# Patient Record
Sex: Female | Born: 1935 | Race: White | Hispanic: No | Smoking: Never smoker
Health system: Southern US, Community
[De-identification: ages and names within clinical notes are randomized; demographics above are authoritative.]

## PROBLEM LIST (undated history)

## (undated) DIAGNOSIS — G4733 Obstructive sleep apnea (adult) (pediatric): Secondary | ICD-10-CM

## (undated) DIAGNOSIS — W19XXXA Unspecified fall, initial encounter: Secondary | ICD-10-CM

## (undated) DIAGNOSIS — I509 Heart failure, unspecified: Secondary | ICD-10-CM

## (undated) DIAGNOSIS — E039 Hypothyroidism, unspecified: Secondary | ICD-10-CM

## (undated) DIAGNOSIS — K219 Gastro-esophageal reflux disease without esophagitis: Secondary | ICD-10-CM

## (undated) DIAGNOSIS — Z8719 Personal history of other diseases of the digestive system: Secondary | ICD-10-CM

## (undated) DIAGNOSIS — E7800 Pure hypercholesterolemia, unspecified: Secondary | ICD-10-CM

## (undated) DIAGNOSIS — I10 Essential (primary) hypertension: Secondary | ICD-10-CM

## (undated) DIAGNOSIS — C541 Malignant neoplasm of endometrium: Secondary | ICD-10-CM

## (undated) DIAGNOSIS — J189 Pneumonia, unspecified organism: Secondary | ICD-10-CM

## (undated) DIAGNOSIS — E119 Type 2 diabetes mellitus without complications: Secondary | ICD-10-CM

## (undated) DIAGNOSIS — M1990 Unspecified osteoarthritis, unspecified site: Secondary | ICD-10-CM

## (undated) DIAGNOSIS — I4891 Unspecified atrial fibrillation: Secondary | ICD-10-CM

## (undated) DIAGNOSIS — R0602 Shortness of breath: Secondary | ICD-10-CM

## (undated) DIAGNOSIS — Z9989 Dependence on other enabling machines and devices: Secondary | ICD-10-CM

## (undated) DIAGNOSIS — G459 Transient cerebral ischemic attack, unspecified: Secondary | ICD-10-CM

## (undated) DIAGNOSIS — Y92009 Unspecified place in unspecified non-institutional (private) residence as the place of occurrence of the external cause: Secondary | ICD-10-CM

## (undated) HISTORY — PX: CATARACT EXTRACTION W/ INTRAOCULAR LENS  IMPLANT, BILATERAL: SHX1307

## (undated) HISTORY — PX: ABDOMINAL HYSTERECTOMY: SHX81

---

## 1979-04-18 DIAGNOSIS — J189 Pneumonia, unspecified organism: Secondary | ICD-10-CM

## 1979-04-18 HISTORY — DX: Pneumonia, unspecified organism: J18.9

## 1994-05-17 DIAGNOSIS — E119 Type 2 diabetes mellitus without complications: Secondary | ICD-10-CM

## 1994-05-17 HISTORY — DX: Type 2 diabetes mellitus without complications: E11.9

## 1997-11-24 ENCOUNTER — Emergency Department (HOSPITAL_COMMUNITY): Admission: EM | Admit: 1997-11-24 | Discharge: 1997-11-24 | Payer: Self-pay | Admitting: Emergency Medicine

## 1998-01-01 ENCOUNTER — Other Ambulatory Visit: Admission: RE | Admit: 1998-01-01 | Discharge: 1998-01-01 | Payer: Self-pay | Admitting: Obstetrics and Gynecology

## 1999-05-26 ENCOUNTER — Ambulatory Visit (HOSPITAL_COMMUNITY): Admission: RE | Admit: 1999-05-26 | Discharge: 1999-05-26 | Payer: Self-pay | Admitting: Obstetrics and Gynecology

## 1999-05-26 ENCOUNTER — Encounter: Payer: Self-pay | Admitting: Obstetrics and Gynecology

## 1999-06-15 ENCOUNTER — Other Ambulatory Visit: Admission: RE | Admit: 1999-06-15 | Discharge: 1999-06-15 | Payer: Self-pay | Admitting: Obstetrics and Gynecology

## 2000-06-16 ENCOUNTER — Ambulatory Visit (HOSPITAL_COMMUNITY): Admission: RE | Admit: 2000-06-16 | Discharge: 2000-06-16 | Payer: Self-pay | Admitting: Obstetrics and Gynecology

## 2000-06-16 ENCOUNTER — Encounter: Payer: Self-pay | Admitting: Obstetrics and Gynecology

## 2000-11-11 ENCOUNTER — Other Ambulatory Visit: Admission: RE | Admit: 2000-11-11 | Discharge: 2000-11-11 | Payer: Self-pay | Admitting: Obstetrics and Gynecology

## 2002-08-21 ENCOUNTER — Other Ambulatory Visit: Admission: RE | Admit: 2002-08-21 | Discharge: 2002-08-21 | Payer: Self-pay | Admitting: Obstetrics and Gynecology

## 2005-07-26 ENCOUNTER — Emergency Department (HOSPITAL_COMMUNITY): Admission: EM | Admit: 2005-07-26 | Discharge: 2005-07-26 | Payer: Self-pay | Admitting: Emergency Medicine

## 2006-04-21 ENCOUNTER — Encounter: Admission: RE | Admit: 2006-04-21 | Discharge: 2006-07-20 | Payer: Self-pay | Admitting: Internal Medicine

## 2006-05-17 ENCOUNTER — Encounter: Admission: RE | Admit: 2006-05-17 | Discharge: 2006-05-17 | Payer: Self-pay | Admitting: Gastroenterology

## 2006-05-31 ENCOUNTER — Encounter: Admission: RE | Admit: 2006-05-31 | Discharge: 2006-05-31 | Payer: Self-pay | Admitting: Gastroenterology

## 2007-10-16 HISTORY — PX: ANKLE FRACTURE SURGERY: SHX122

## 2007-10-19 ENCOUNTER — Inpatient Hospital Stay (HOSPITAL_COMMUNITY): Admission: EM | Admit: 2007-10-19 | Discharge: 2007-10-21 | Payer: Self-pay | Admitting: Emergency Medicine

## 2007-10-31 ENCOUNTER — Other Ambulatory Visit: Payer: Self-pay | Admitting: Orthopedic Surgery

## 2007-10-31 ENCOUNTER — Emergency Department (HOSPITAL_COMMUNITY): Admission: EM | Admit: 2007-10-31 | Discharge: 2007-10-31 | Payer: Self-pay | Admitting: Emergency Medicine

## 2007-11-03 ENCOUNTER — Inpatient Hospital Stay (HOSPITAL_COMMUNITY): Admission: RE | Admit: 2007-11-03 | Discharge: 2007-11-04 | Payer: Self-pay | Admitting: Orthopedic Surgery

## 2010-03-24 ENCOUNTER — Encounter: Admission: RE | Admit: 2010-03-24 | Discharge: 2010-03-24 | Payer: Self-pay | Admitting: Orthopedic Surgery

## 2010-03-25 ENCOUNTER — Ambulatory Visit (HOSPITAL_BASED_OUTPATIENT_CLINIC_OR_DEPARTMENT_OTHER): Admission: RE | Admit: 2010-03-25 | Discharge: 2010-03-25 | Payer: Self-pay | Admitting: Orthopedic Surgery

## 2010-10-31 LAB — APTT
aPTT: 28 seconds (ref 24–37)
aPTT: 34 seconds (ref 24–37)

## 2010-10-31 LAB — PROTIME-INR
INR: 1.31 (ref 0.00–1.49)
INR: 2 — ABNORMAL HIGH (ref 0.00–1.49)
Prothrombin Time: 16.5 seconds — ABNORMAL HIGH (ref 11.6–15.2)
Prothrombin Time: 22.8 seconds — ABNORMAL HIGH (ref 11.6–15.2)

## 2010-10-31 LAB — BASIC METABOLIC PANEL
CO2: 27 mEq/L (ref 19–32)
Chloride: 106 mEq/L (ref 96–112)
Creatinine, Ser: 1.03 mg/dL (ref 0.4–1.2)
GFR calc Af Amer: >60 mL/min (ref >60)
Potassium: 4.2 mEq/L (ref 3.5–5.1)

## 2010-12-30 NOTE — Discharge Summary (Signed)
[name], [name] NO.:  [id number]   MEDICAL RECORD NO.:  [id number]          PATIENT TYPE:  INP   LOCATION:  1613                         FACILITY:  [facility]   PHYSICIAN:  Almedia Balls. Ranell Patrick, M.D. DATE OF BIRTH:  18-Jan-1926   DATE OF ADMISSION:  10/18/2007  DATE OF DISCHARGE:  10/21/2007                               DISCHARGE SUMMARY   ADDENDUM:   The patient is to be discharged on October 21, 2007 to [facility].  The patient was awaiting skilled nursing facility  placement.  The patient did undergo ORIF of a right trimalleolar ankle  fracture on October 26, 2007 at 0830 hours.  Arrangements will be made for  the patient to be transferred to this [facility] on October 26, 2007; the patient needs to arrive at approximately 7  a.m. to undergo surgery.  The patient will be n.p.o. after midnight on  October 25, 2007.  Patient is currently on Lovenox 120 mg subcu injection  q.12 h.  Lovenox needs to be stopped 12 hours prior to surgery.  Due to  the patient's slow progress with physical therapy and difficulty with  transfers, indwelling Foley catheter remains; it should be removed once  the patient is able to ambulate nonweightbearing on the right lower leg  with the aid of a walker.   Repeat CBC done on October 20, 2007:  H&H 9.4 and 27.1; white count was  7700.  Urinalysis showed 0-2 white blood cells per high-power field,  rare bacteria, nitrates were positive and leukocyte esterase was large.  At this time, a urine culture is pending.   The patient's condition remained unchanged from previous exam on October 20, 2007.  The patient was comfortable without any complaints on October 21, 2007.  The patient is to be discharged to a skilled nursing facility  later today.      Richardean Canal, P.A.      Almedia Balls. Ranell Patrick, M.D.  Electronically Signed    GC/MEDQ  D:  10/21/2007  T:  10/21/2007  Job:  [id number]   cc:   Leonides Grills, M.D.  Fax: [phone]

## 2010-12-30 NOTE — Consult Note (Signed)
NAMEJALEA, [name] NO.:  [id number]   MEDICAL RECORD NO.:  [id number]          PATIENT TYPE:  INP   LOCATION:  1613                         FACILITY:  [facility]   PHYSICIAN:  Leonides Grills, M.D.     DATE OF BIRTH:  [birth year]   DATE OF CONSULTATION:  10/19/2007  DATE OF DISCHARGE:                                 CONSULTATION   CHIEF COMPLAINT:  Right-ankle pain.   HISTORY:  This is a 77 year old female, who slipped and fell on the ice  yesterday and sustained the above injury.  She was then taken to [facility] ER, where x-rays were obtained.  Dr. Ranell Patrick was consulted and she  was treated with closed reduction and cast application.  She had an  abrasion of the medical aspect of the ankle, per Dr. Ranell Patrick, and this  was not an open injury.  She is comfortable in her splint and feels a  lot better.   She has insulin dependent diabetes since 1996.  She has atrial  fibrillation and is on Coumadin.  She has hypertension.  She has had a  hysterectomy in 1996.   She takes Avalide, Coreg, Lotrel, metformin, Coumadin and insulin.   She lives alone, does not smoke, no alcohol use.   No known drug allergies.   PHYSICAL EXAM:  Active, passive range of motion does not elicit any  pain.  Toes are warm.  Sensation:  Intact light touch of her toes.  No  itch in area of splint.   X-rays were reviewed and show a trimalleolar ankle fracture that is  nearly anatomically reduced with a distal tib-fib syndesmotic rupture.   IMPRESSION:  Right trimalleolar ankle fracture with a distal tibial-  fibular syndesmotic rupture.   PLAN:  I explained to Ms. Moskowitz, as well as her family, that she  will require open reduction and internal fixation of her trimalleolar  ankle fracture, likely without posterior malleolar fixation and open  reduction and internal fixation of the distal tibial syndesmosis.  Due  to the fact she is a diabetic, she will require more industrial strength  fixation, due to the likelihood that she does have neuropathy in her  feet and ankles.  I explained to her that we cannot operate at this  time, due to the fact that she is likely swollen, since it is well-  beyond the six-hour window that is typically allowed immediately after  the injury.  We will tentatively schedule her for Tuesday or Wednesday  of next week.  She will require a nursing home type of stay until the  surgery, which will be an outpatient surgery, and then once the surgery  is done, she will likely stay 23 hours at the surgery center and go back  to nursing home.   We went over the risks of surgery, which include infection, nerve or  vessel injury, nonunion, malunion, hardware irritation, hardware  failure, stiffness, arthritis, persistent pain, worsening pain,  instability, especially at the distal tib-fib syndesmosis, prolonged  recovery, Charcot arthropathy, DVT, PE, and even if she does develop a  deep infection secondary to the  diabetes, amputation were all explained.  Questions were encouraged and answered.  We will proceed with this next  week.  Elevation and active range of motion exercises were encouraged.  She is  nonweightbearing in the cast.  She will take Lovenox until one day prior  to surgery and will discontinue Lovenox 12 hours prior to surgery and  then restart her on Lovenox and likely get her back on the Coumadin  after surgery is completed.      Leonides Grills, M.D.  Electronically Signed     PB/MEDQ  D:  10/19/2007  T:  10/19/2007  Job:  [id number]

## 2010-12-30 NOTE — Consult Note (Signed)
NAMEASTELLA, DESIR NO.:  [id number]   MEDICAL RECORD NO.:  [id number]          PATIENT TYPE:  EMS   LOCATION:  ED                           FACILITY:  [facility]   PHYSICIAN:  Madaline Savage, MD        DATE OF BIRTH:  07-Aug-1926   DATE OF CONSULTATION:  10/18/2007  DATE OF DISCHARGE:                                 CONSULTATION   CONSULTING PHYSICIAN:  Dr. Ranell Patrick.   REASON FOR CONSULTATION:  Cardiac clearance and medical management.   HISTORY OF PRESENT ILLNESS:  Ms. Wittmann is a 77 year old lady with a  history of diabetes mellitus, hypertension and atrial fibrillation who  comes to the emergency room after she slipped on ice and hit her right  leg.  She apparently has a trimalleolar fracture in her right ankle and  has a proximal and distal fibular fracture on the right side.  She is  being admitted by the orthopedic service, and we have been asked to  medically clear her for surgery.  She does not have any history of  coronary artery disease.  She denies having any chest pain or shortness  of breath with exertion.  She states she was moderately active prior to  her fall and never had discomfort with activity.  She denies any  orthopnea, paroxysmal nocturnal dyspnea, any abdominal pain, nausea,  vomiting, diarrhea, constipation.   PAST MEDICAL HISTORY:  1. Diabetes mellitus since 1996.  2. History of hypertension.  3. History of atrial fibrillation for years.   PAST SURGICAL HISTORY:  Hysterectomy in 1996.   ALLERGIES:  No known drug allergies.   CURRENT MEDICATIONS:  She is on Avalide, Coreg, Digitek, Lotrel,  metformin, Coumadin and insulin.  She cannot tell me the doses of these  medications.  I tried to reach her pharmacy, but they are not open at  this time.   SOCIAL HISTORY:  She lives at home.  There is no history of smoking,  alcohol or drug abuse.   FAMILY HISTORY:  Noncontributory.   REVIEW OF SYSTEMS:  GENERAL:  She denies any recent weight  loss, weight  gain.  No fevers or chills.  HEENT:  No headaches, no blurred vision, no  sore throat.  CARDIOVASCULAR:  Denies chest pain.  The patient has no  paroxysmal nocturnal dyspnea, orthopnea.  GASTROINTESTINAL:  No  abdominal pain, nausea, vomiting, diarrhea, or constipation.  GENITOURINARY:  No dysuria, polyuria.   PHYSICAL EXAMINATION:  GENERAL:  She is alert and oriented x3.  VITAL SIGNS:  Temperature is 97, pulse rate 106, blood pressure 159/82,  respiratory rate 16, oxygen saturation 96%.  HEENT:  Head atraumatic, normocephalic.  Pupils bilaterally equal and  reactive to light.  Mucous membranes are moist.  NECK:  Supple.  No JVD.  No carotid bruits.  CARDIOVASCULAR:  S1 and S2 heard.  Regular rate and rhythm.  No murmurs,  rubs or gallops.  CHEST:  Clear to auscultation.  ABDOMEN:  Soft.  Bowel sounds heard.  EXTREMITIES:  There is swelling over the right ankle and tenderness over  the right ankle.  Labs show an INR of 2.0.  White count is elevated at 16.3.  Hemoglobin  11.8.  Platelets 232.  Sodium 137, potassium 4.1, chloride 104, bicarb  20, BUN 17, creatinine 0.9, glucose 166.  X-ray of the right ankle shows  trimalleolar fracture/dislocation.  X-ray of the right fibula shows  proximal and distal fibular fractures.  EKG shows atrial fibrillation at  a rate of 101 per minute.  There is nonspecific ST/T wave abnormality,  probably digitalis effect.   IMPRESSION:  1. Atrial fibrillation, which is rate controlled at this time.  2. Diabetes mellitus type 2.  3. Hypertension.  4. Right ankle and fibular fracture.  5. Leukocytosis.   PLAN:  1. Right ankle and fibula fractures.  She is being admitted by the      orthopedic service, who is planning on surgery.  At this time, she      is on Coumadin and her Coumadin has to be reversed prior to      surgery.  Her cardiac risk factors are diabetes mellitus and      hypertension and atrial fibrillation, but she does not  have a      history of coronary artery disease and she has no history      suggesting that she has coronary artery disease at this time.  She      has good exercise tolerance, and at this time she is being cleared      for surgery.  We will use perioperative beta blocker on this      patient to decrease the cardiac risk during surgery.  2. Atrial fibrillation.  She is rate controlled at this time.  She      uses digoxin at home.  We do not know the dose of her home digoxin.      I will check a digoxin level and start her on a low dose of      digoxin.  This can be changed to her regular dose once her home      doses are available.  I will also start her on a low dose of Coreg,      which she is on at home.  3. Diabetes mellitus.  She states she is on insulin at home.  She is      not able to tell me what kind of insulin. She tells me she takes it      once a day.  I will start her on Lantus 20 units daily and put her      on a sliding-scale.  We will change it to her home dose of insulin      once her home medications are known.  I will get a hemoglobin A1c      on her.  4. Leukocytosis.  This is likely secondary to stress reaction.  We      will get a UA C&S on her.  5. Hypertension.  I will continue her Avalide and Lotrel at low dose.      We will plan to change to her home dose once her home doses are      known.   Thank you for consulting this patient to [place].  We will continue to follow  this patient with you.      Madaline Savage, MD  Electronically Signed     PKN/MEDQ  D:  10/18/2007  T:  10/18/2007  Job:  [id number]

## 2010-12-30 NOTE — Discharge Summary (Signed)
[name], [name] NO.:  [id number]   MEDICAL RECORD NO.:  [id number]          PATIENT TYPE:  INP   LOCATION:  1613                         FACILITY:  [facility]   PHYSICIAN:  Leonides Grills, M.D.     DATE OF BIRTH:  22-Mar-1926   DATE OF ADMISSION:  10/18/2007  DATE OF DISCHARGE:                               DISCHARGE SUMMARY   ADMISSION DIAGNOSES:  1. Right ankle trimalleolar fracture.  2. History of atrial fibrillation on Coumadin.  3. Diabetes mellitus.  4. Leukocytosis.  5. Hypertension.  6. Sleep apnea with use of CPAP.   DISCHARGE DIAGNOSES:  1. Right trimalleolar ankle fracture with distal tibial fibula      syndesmotic rupture.  2. History of hypertension.  3. History of atrial fibrillation on Coumadin.  4. Diabetes mellitus type 2.  5. Leukocytosis, resolved.  6. Sleep apnea with the use of CPAP.   HISTORY OF PRESENT ILLNESS:  [name] is a 77 year old female who  slipped and fell on the ice on October 18, 2007 sustaining a right  trimalleolar ankle fracture with syndesmotic rupture.  The patient was  taken to [facility] emergency room where x-rays were obtained  and, again, showed the trimalleolar ankle fracture on the right with  syndesmotic rupture.  The patient was treated by Dr. Ranell Patrick, treated  with closed reduction and cast was applied.  Per Dr. Ranell Patrick, the patient  had an abrasion medial aspect of her ankle and this was not an open  injury.  The patient was admitted for pain control, also for medical  clearance for ORIF of the right ankle fracture.  This surgery is to  occur next week, either on Tuesday or Wednesday due to the fact that she  was unable to have surgery secondary to swelling of the ankle.   ALLERGIES:  No known drug allergies.   MEDICATIONS:  Medications on admission:  1. Avalide.  2. Coreg.  3. Digitate.  4. Lotrel.  5. Metformin.  6. Coumadin.  7. Insulin.   HOSPITAL COURSE:  The patient was admitted  through the [facility] emergency room by Dr. Ranell Patrick.  She did receive cardiac  clearance by [facility] who evaluated her.  Atrial fibrillation  was rate controlled at this time.  Her diabetes mellitus was controlled  sliding scale and she also was on metformin.  Hypertension was  controlled.  She did have leukocytosis on admission and this was felt to  be secondary to stress reaction.  A UA and C and S were checked.   On hospital day 1, October 19, 2007, the patient was resting comfortably  no complaints.  Temperature max was 101.0.  Otherwise her vital signs  were stable.  H and H were 9.8/28.8 and white count was trending down  with 8,300 from 16,300 on admission.  Hemoglobin A1C was 7.4.  Mean  plasma glucose was 186.  Cardiac enzymes were checked and the patient's  creatinine kinase total was elevated, however, troponin and CK-MB's  remained within normal limits.  No evidence of a cardiac event.  UA  showed positive  nitrates negative for leukocyte esterase, bacteria many,  few squamous cells, protein was 100, white blood cells within normal,  red blood cells were within normal limits.   On hospital day 2, the patient was doing well.  She was having no chest  pain, no shortness of breath, no nausea, vomiting or calf pain.  No  bowel movements.  Pain under control.  The patient afebrile with vital  signs stable.  Pro Time was 16.5, INR 1.3.  No other labs were ordered.  Right lower leg calf was supple and nontender.  EHL and FHL were intact.  Splint fits well.  Toes warm and dry.  Sensation intact to light touch  throughout the toes.   MEDICATIONS:  Medications on the floor:  1. Lantus 20 units subcutaneously nightly.  2. Sliding scale NovoLog subcutaneously t.i.d. WC with the following      sliding scale:      a.     CBG 70 to 100 - no units.      b.     CBG 101 to 150 - 2 units.      c.     CBG 151 to 200 - 3 units.      d.     CBG 201 to 250 - 5 units.      e.      CBG 251 to 300 - 7 units.      f.     CBG 301 to 350 - 9 units.      g.     CBG 351 to 400 - 11 units.      h.     Greater than 400 - verify with lab and call M.D.  3. NovoLog 2 to 5 units subcutaneously nightly with the following      sliding scale in place:      a.     __________ hypoglycemic.      b.     70 to 200 - 0 units.      c.     CBG 201 to 250 - 2 units.      d.     CBG 250 to 300 - 3 units.      e.     CBG 301 to 350 - 4 units.      f.     CBG 351-400 - 5 units.      g.     Greater than 400 - verify with laboratory and call M.D.  4. Glucophage 500 mg p.o. b.i.d. WC.  5. Digoxin 0.125 mg one p.o. daily.  6. Coreg 6.25 mg p.o. b.i.d. WC.  7. Avapro 150 mg one p.o. daily.  8. Hydrochlorothiazide 12.5 mg p.o. daily.  9. Norvasc 5 mg one p.o. daily.  10.Lotensin 10 mg one p.o. daily.  11.Dilaudid 7.5 mg IV q.4h. PCA.  12.Lovenox 30 mg subcutaneously b.i.d.  13.Multivitamin one p.o. daily.  14.Calcium carbonate, vitamin D3, one p.o. daily.  15.Omega-3 fatty acids 1 gram p.o. b.i.d.  16.Robaxin 500 mg IV q.8h. p.r.n. spasms.  17.Dilaudid 0.5 to 1.0 mg IV q.2h. p.r.n., none taken.  18.Tylenol 650 mg one p.o. q.6h. p.r.n., none taken.  19.Zofran 4 mg IV q.6h. p.r.n., none taken.  20.IV Benadryl 12.5 mg q.6h. p.r.n., none taken.  21.Benadryl 12.5 mg p.o. q.6h. p.r.n., none taken.  22.Propoxyphene-N 100/650 mg one to two tablets p.o. q.4h. p.r.n.      pain.   CLINICAL DATA:  X-rays as follows:  Right  ankle, three views, dated  October 18, 2007 showed trimalleolar fracture dislocation.  Right tib/fib  dated October 18, 2007 showed proximal distal fibular fracture.  Two views  of the right ankle taken October 18, 2007 showed two views through plaster  cast splint showing oblique fractures of the distal fibular shaft, well  aligned and only a few mm lateral displacement distal fragment.  Perching of the distal tibia on the talar dome.  A transverse medial  malleolar fracture present.   Three views right ankle, October 18, 2007  showed patient to be status post reduction tib/fib which is now properly  related to the talar dome.  Distal fibular fracture, medial malleolar  fracture and posterior left tibial fractures all remain evident.  Impression:  Tibia relocated relative to talus.   EKG dated October 18, 2007 showed atrial fibrillation with rapid  ventricular response, ventricular rate 101 beats per minute.  PRT axis  was 57 to 195.   LABORATORY DATA:  Routine labs on admission basic metabolic panel with  sodium 140, potassium 4.2, chloride 109, bicarb 23, glucose elevated at  179, BUN 18, creatinine 1.02. Coag's on admission PT was 23.2, elevated,  INR was 2.0.  CBC on admission white count 16,300, hemoglobin 11.8,  hematocrit 34.7, platelet count 232,000.  Urinalysis on admission with  pH 5.0, trace blood, protein was 100, positive nitrates, negative  leukocytes, few squamous epithelial cells, many bacteria, white blood  cells  0-2 within normal limits and red blood cells per high power field  were 0-2.   DISCHARGE INSTRUCTIONS:  __________ to be tested by skilled facility  physician.   SPECIAL INSTRUCTIONS:  Right lower leg posterior splint is to be kept  clean and dry and intact.  The patient is non weight-bearing on the  right leg.  She is to pivot to chair as tolerated.  The patient is to  remain on Lovenox until after her surgery.  She will need to stop the  Lovenox 12 hours prior to surgery, which is to occur either next Tuesday  or Wednesday.  The skilled nursing facility will be notified of the  patient's surgical date and location.   DIET:  CHO  modified 60 gram CHO.   ACTIVITY:  Patient is non weight-bearing right lower leg.  She is to  pivot to chair with crutches, walker and also to bedside commode as  tolerated.   CONDITION ON DISCHARGE:  Patient is stable and at this time is awaiting  skilled nursing facility placement.   PLAN:  1. Outpatient  open reduction, internal fixation of the right      trimalleolar ankle fracture with tib/fib syndesmotic rupture.  2. Bridging with Lovenox until after surgery and then place the      patient back on Coumadin after surgery.  3. The patient's urinalysis will be repeated with culture and      sensitivity prior to the patient's discharge.  4. Also, will have the patient work with PT/OT on pivoting and      transfers.  If the patient is able to pivot and transfer well, and      maintain her non weight-bearing on the right lower leg, then Foley      catheter will be removed; otherwise, the patient will be sent with      indwelling Foley catheter.      Richardean Canal, P.A.      Leonides Grills, M.D.  Electronically Signed    GC/MEDQ  D:  10/20/2007  T:  10/20/2007  Job:  [id number]

## 2010-12-30 NOTE — Op Note (Signed)
[name], [name] NO.:  [id number]   MEDICAL RECORD NO.:  [id number]          PATIENT TYPE:  INP   LOCATION:  1604                         FACILITY:  [facility]   PHYSICIAN:  Leonides Grills, M.D.     DATE OF BIRTH:  [birth year]   DATE OF PROCEDURE:  11/03/2007  DATE OF DISCHARGE:                               OPERATIVE REPORT   PREOPERATIVE DIAGNOSIS:  1. Right trimalleolar ankle fracture.  2. Right distal tib-fib syndesmosis.   POSTOPERATIVE DIAGNOSIS:  1. Right trimalleolar ankle fracture.  2. Right distal tib-fib syndesmosis.   OPERATION:  1. Open reduction and internal fixation right trimalleolar ankle      fracture without posterior malleolar fixation.  2. Open reduction and internal fixation right distal tib-fib      syndesmosis.  3. Stress x-rays, right ankle.   ANESTHESIA:  General.   SURGEON:  Leonides Grills, M.D.   ASSISTANT:  Richardean Canal, P.A.-C.   ESTIMATED BLOOD LOSS:  Minimal.   TOURNIQUET TIME:  Approximately 1 hour.   COMPLICATIONS:  None.   DISPOSITION:  Stable to the PR.   INDICATIONS:  This 77 year old female is a diabetic and has peripheral  neuropathy.  She sustained the above injury.  She was consented for the  above procedure.  All risks including infection, nerve or vessel injury,  nonunion, malunion, hardware rotation or hardware failure, stiffness,  arthritis, persistent pain, worsening pain, Charcot arthropathy, deep  infection, and the possibility of amputation were all explained,  questions were encouraged and answered.   OPERATION:  The patient was brought to the operating room and placed in  a supine position. After adequate general endotracheal anesthesia was  administered as well as Ancef 1 gram IV piggyback, a bump was placed on  the right ipsilateral hip.  The right lower extremity is prepped and  draped in a sterile manner over a proximally placed thigh tourniquet.  The limb was gravity exsanguinated and  tourniquet elevated to 290 mmHg.  A longitudinal incision full thickness over the lateral aspect of the  lateral malleolus was then made.  Dissection was carried down directly  to bone full thickness flap.  We then identified the fracture. The  lateral malleolus was then anatomically reduced.  A seven hole 1/3  tubular plate was then applied.  Three 3.5 mm fully threaded cortical  set screws were placed in the proximal fragment while the fracture was  reduced.  We then reduced the syndesmosis and with this held in a  reduced position, two 2 mm K-wires were then placed. This held it in a  provisional fixation and reduction. We then applied one 3.5 mm fully  threaded cortical set screw distal to the fracture site.  We then made a  longitudinal incision over the medial malleolus.  There was a small area  of eschar on the anterior aspect of the ankle from a previous fracture  pressure sore, but this was done prior to the reduction, likely when the  ankle was dislocated. We made the incision about 1 cm posterior to this  and was wrinkles in the skin prior to the  surgery, as well, and there  was minimal swelling.  Dissection was carried down full thickness  directly to bone.  The fracture site was encountered.  We opened the  fracture site and removed any hematoma and any fragments in the joint.  We removed the hematoma and periosteum that was flapped into the  fracture site.  We anatomically reduced the fracture with a two point  reduction clamp and put two 3.5 mm fully threaded cortical set screws  using a 3.5 mm drill hole respectively.  This had excellent purchase and  maintenance of the compression.  The clamp was removed at the time the x-  rays were obtained.  We then went back to the syndesmosis.  This was  anatomically reduced with a two point reduction clamp and three 3.5 mm  fully threaded cortical set screws were placed using a 3.2 mm drill hole  respectively.  This was a four  cortices syndesmotic screw and this had  excellent purchase and fixation. Final stress x-rays, AP and lateral and  mortise views, and showed no gross motion, fixation in proper position  and excellent alignment, as well, posterior translation of the talus  from the ankle mortise showed no instability posteriorly and we did not  proceed with fixation of the posterior malleolus fracture fragment. The  screws that were placed were 4.5 mm fully threaded cortical set screws  from [employer].  The tourniquet was deflated, hemostasis was obtained.  No  pulsatile bleeding.  The wound was copiously irrigated with normal  saline.  The subcu was closed with 2-0 Vicryl and the skin was closed  with 4-0 nylon.  A sterile dressing was applied.  A modified Jones  dressing was applied with the ankle in neutral dorsiflexion.  The  patient was stable to the PR.      Leonides Grills, M.D.  Electronically Signed     PB/MEDQ  D:  11/03/2007  T:  11/03/2007  Job:  [id number]

## 2010-12-30 NOTE — Discharge Summary (Signed)
[name], [name] NO.:  [id number]   MEDICAL RECORD NO.:  [id number]          PATIENT TYPE:  INP   LOCATION:  1604                         FACILITY:  [facility]   PHYSICIAN:  [name], M.D.     DATE OF BIRTH:  1926-05-19   DATE OF ADMISSION:  11/03/2007  DATE OF DISCHARGE:                               DISCHARGE SUMMARY   ADMISSION DIAGNOSES:  1. Right trimalleolar ankle fracture.  2. History of atrial fibrillation on Coumadin.  3. Diabetes mellitus.  4. Hypertension.  5. Sleep apnea with the use of CPAP.   DISCHARGE DIAGNOSES:  1. Status post open reduction internal fixation of right trimalleolar      ankle fracture without posterior malleolar fixation.  2. Open reduction internal fixation of right distal tib-fib      synostosis.  3. History of atrial fibrillation on Coumadin.  4. Diabetes mellitus.  5. Hypertension.  6. Sleep apnea.  7. Urinary tract infection.   HISTORY OF PRESENT ILLNESS:  [name] is a 77 year old female with  diabetes and peripheral neuropathy.  She sustained a right trimalleolar  ankle fracture and right distal tib-fib syndesmosis injury on October 18, 2007.  She was initially admitted for pain control and comfort status  post injury to the right ankle.  Unfortunately, due to the patient's  edema in the lower leg, surgery was postponed.  The patient was  readmitted on November 03, 2007, to undergo ORIF of the right ankle.   PROCEDURE:  November 03, 2007, right open reduction internal fixation of  trimalleolar ankle fracture without posterior malleolar fixation, open  reduction internal fixation of distal tib-fib syndesmosis.   ANESTHESIA:  General.   SURGEON:  [name], M.D.   ASSISTANT:  Sheila Canal, PA-C.   The patient underwent procedure without any complications and returned  to the postoperative care unit in good stable condition.   HOSPITAL COURSE:  The patient was admitted on November 03, 2007, to undergo  right trimalleolar ankle fracture and right distal tib-fib syndesmosis  ORIF.  Again, the patient received previous cardiac and medical  clearance on her prior admission.   On postop day #1, the patient was awake, alert, oriented.  Pain under  control.  No chest pain or shortness of breath.  The patient was  afebrile, vital signs stable.  H&H 10.6 and 30.8.  Sodium was 132,  potassium 4.8, chloride 102, bicarb 25, BUN 18, creatinine 1.08 and  glucose 166.  Hemoglobin A1c was 7.  INR was 1 on November 03, 2007.   MEDICATIONS ON THE FLOOR:  1. Sliding scale insulin NovoLog 1-11 units t.i.d. w.c.  CBG 70-100: 0      units. CBG was 101-150: 2 units, 151-200:  3 units, CBG 201-250: 5      units, CBG 251-300: 7 units, 301-350:  9 units, 351-400:  11 units,      above 400:  Verify lab and call MD.  2. NovoLog sliding scale of 2-5 units subcu nightly. CBG below 70:      Hypoglycemic protocol, CBG 70-200:  0 units, 201-250: 2 units, 251-  300:  3 units, CABG 301-350:  4 units, 351-400:  5 units, above of      400:  Verify and call MD.  3. Digoxin 0.125 mg p.o. daily.  4. Coreg 6.25 mg p.o. b.i.d. w.c.  5. Avapro 150 mg one p.o. daily.  6. Hydrochlorothiazide 12.5 mg one p.o. daily.  7. Norvasc 75 mg p.o. daily.  8. Lotensin 10 mg one p.o. daily.  9. Multivitamin 1 tablet p.o. daily.  10.Calcium carbonate/vitamin B3 one tablet p.o. daily.  11.Omega III fatty acid 1 gram p.o. b.i.d. p.c.  12.Lovenox 30 mg one subcu q.12h.  13.Glucotrol 2.5 mg one p.o. daily before breakfast.  14.Morphine 2-3 mg IV q.3h. p.r.n. severe pain.  15.Robaxin 500 mg one p.o. q.8h. p.r.n.  16.Zofran 4 mg one p.o. q.6h. p.r.n.  17.Darvocet 1-2 tablets p.o. q.4h. p.r.n.  18.__________ 4 mg p.o. q.4h. p.r.n.  19.Cipro 250 mg one p.o. q.12h. x5 days to begin on November 04, 2007.   ADMISSION LABORATORY DATA:  Hemoglobin 12.2, hematocrit 35.4.  Protime  was 13.8, INR 1, PTT was 29.  Sodium 134, potassium 4.5, chloride  101,  bicarb 24, glucose was high at 181, BUN also high at 181.  Hemoglobin  A1c was 7.  Mean plasmin glucose was 172.  Urinalysis:  Urinalysis on admission showed small blood, urine protein  was 30, leukocyte esterase was moderate,  many squamous cells, white  blood cells per high-power field were 11-20, bacteria was many, red  blood cells per high-power field was 3-6.  Urine culture on October 18, 2007, and October 20, 2007, both showed Klebsiella pneumoniae.   DISCHARGE INSTRUCTIONS:  Floor meds to be adjusted by skilled facility  physician.   SPECIAL INSTRUCTIONS:  1. Right lower leg in Jones dressing which is to be kept clean, dry      and intact.  The patient nonweightbearing on the right leg.  She is      to pivot to chair as tolerated.  2. The patient will remain on Lovenox until Coumadin becomes      therapeutic.  3. Foley catheter remains.  To be removed at skilled facility once      patient is able to pivot to chair.  4. Encouragement of wiggling toes.   DIET:  CHA modified medium.   ACTIVITY:  Patient is nonweightbearing right leg.  She is to pivot to  chair with crutches, walker, bedside commode as tolerated.   CONDITION ON DISCHARGE:  Patient discharged to skilled facility in good  stable condition.   FOLLOW UP:  Patient to follow up with Dr. Lestine Box in the office two  weeks from surgery.  Again, surgical date was November 03, 2007.  At that  time, x-rays will be obtained, Jones dressing will be removed.  The  patient will be placed in a short-leg cast.  Please call office at [phone] for appointment.      [name], P.A.      [name], M.D.  Electronically Signed    GC/MEDQ  D:  11/04/2007  T:  11/04/2007  Job:  [id number]

## 2011-05-11 LAB — URINE CULTURE: Colony Count: >=100000

## 2011-05-11 LAB — POCT I-STAT, CHEM 8
Calcium, Ion: 1.17
Chloride: 105
Glucose, Bld: 179 — ABNORMAL HIGH
HCT: 39
Hemoglobin: 13.3

## 2011-05-11 LAB — CBC
HCT: 27.1 — ABNORMAL LOW
HCT: 28.8 — ABNORMAL LOW
Hemoglobin: 9.8 — ABNORMAL LOW
Hemoglobin: 10.6 — ABNORMAL LOW
MCHC: 34
MCHC: 34.1
MCHC: 34.3
MCV: 82.1
MCV: 82.3
Platelets: 175
Platelets: 232
RBC: 3.49 — ABNORMAL LOW
RBC: 4.21
RBC: 3.69 — ABNORMAL LOW
RDW: 15
RDW: 15.5
WBC: 8.6

## 2011-05-11 LAB — BASIC METABOLIC PANEL
BUN: 17
BUN: 18
CO2: 20
CO2: 23
CO2: 24
CO2: 25
Calcium: 8.3 — ABNORMAL LOW
Calcium: 8.7
Chloride: 104
Chloride: 102
Creatinine, Ser: 0.95
GFR calc Af Amer: >60
GFR calc non Af Amer: 49 — ABNORMAL LOW
GFR calc non Af Amer: 52 — ABNORMAL LOW
Glucose, Bld: 166 — ABNORMAL HIGH
Glucose, Bld: 179 — ABNORMAL HIGH
Glucose, Bld: 166 — ABNORMAL HIGH
Potassium: 4.2
Potassium: 4.8
Sodium: 140
Sodium: 134 — ABNORMAL LOW

## 2011-05-11 LAB — DIFFERENTIAL
Basophils Relative: 0
Eosinophils Relative: 1
Monocytes Absolute: 0.7
Monocytes Relative: 9
Neutro Abs: 6

## 2011-05-11 LAB — URINALYSIS, ROUTINE W REFLEX MICROSCOPIC
Bilirubin Urine: NEGATIVE
Bilirubin Urine: NEGATIVE
Bilirubin Urine: NEGATIVE
Ketones, ur: NEGATIVE
Ketones, ur: NEGATIVE
Ketones, ur: NEGATIVE
Nitrite: POSITIVE — AB
Nitrite: NEGATIVE
Protein, ur: NEGATIVE
Specific Gravity, Urine: 1.013
Urobilinogen, UA: 0.2
Urobilinogen, UA: 1
pH: 5

## 2011-05-11 LAB — CARDIAC PANEL(CRET KIN+CKTOT+MB+TROPI)
CK, MB: 1.6
Relative Index: 0.3
Total CK: 428 — ABNORMAL HIGH
Troponin I: 0.03

## 2011-05-11 LAB — HEMOGLOBIN AND HEMATOCRIT, BLOOD
HCT: 35.4 — ABNORMAL LOW
Hemoglobin: 12.2

## 2011-05-11 LAB — PROTIME-INR
INR: 1.2
INR: 2 — ABNORMAL HIGH
Prothrombin Time: 15.4 — ABNORMAL HIGH
Prothrombin Time: 16.5 — ABNORMAL HIGH
Prothrombin Time: 23.2 — ABNORMAL HIGH
Prothrombin Time: 13.8

## 2011-05-11 LAB — URINE MICROSCOPIC-ADD ON

## 2011-05-11 LAB — APTT
aPTT: 37
aPTT: 39 — ABNORMAL HIGH

## 2011-05-11 LAB — DIGOXIN LEVEL: Digoxin Level: 0.4 — ABNORMAL LOW

## 2011-05-11 LAB — HEMOGLOBIN A1C
Hgb A1c MFr Bld: 7 — ABNORMAL HIGH
Mean Plasma Glucose: 186

## 2011-05-11 LAB — TROPONIN I: Troponin I: 0.02

## 2011-05-11 LAB — CK TOTAL AND CKMB (NOT AT ARMC): Relative Index: 0.7

## 2011-10-16 DIAGNOSIS — G459 Transient cerebral ischemic attack, unspecified: Secondary | ICD-10-CM

## 2011-10-16 HISTORY — DX: Transient cerebral ischemic attack, unspecified: G45.9

## 2011-11-09 ENCOUNTER — Encounter (HOSPITAL_BASED_OUTPATIENT_CLINIC_OR_DEPARTMENT_OTHER): Payer: Self-pay | Admitting: *Deleted

## 2011-11-09 ENCOUNTER — Emergency Department (HOSPITAL_BASED_OUTPATIENT_CLINIC_OR_DEPARTMENT_OTHER)
Admission: EM | Admit: 2011-11-09 | Discharge: 2011-11-09 | Disposition: A | Discharge status: Home / Self Care | Payer: Medicare Other | Attending: Emergency Medicine | Admitting: Emergency Medicine

## 2011-11-09 ENCOUNTER — Other Ambulatory Visit: Payer: Self-pay

## 2011-11-09 DIAGNOSIS — S0510XA Contusion of eyeball and orbital tissues, unspecified eye, initial encounter: Secondary | ICD-10-CM | POA: Insufficient documentation

## 2011-11-09 DIAGNOSIS — Z8542 Personal history of malignant neoplasm of other parts of uterus: Secondary | ICD-10-CM | POA: Insufficient documentation

## 2011-11-09 DIAGNOSIS — Z8673 Personal history of transient ischemic attack (TIA), and cerebral infarction without residual deficits: Secondary | ICD-10-CM | POA: Insufficient documentation

## 2011-11-09 DIAGNOSIS — E119 Type 2 diabetes mellitus without complications: Secondary | ICD-10-CM | POA: Insufficient documentation

## 2011-11-09 DIAGNOSIS — I10 Essential (primary) hypertension: Secondary | ICD-10-CM

## 2011-11-09 DIAGNOSIS — Y9229 Other specified public building as the place of occurrence of the external cause: Secondary | ICD-10-CM | POA: Insufficient documentation

## 2011-11-09 DIAGNOSIS — W1809XA Striking against other object with subsequent fall, initial encounter: Secondary | ICD-10-CM | POA: Insufficient documentation

## 2011-11-09 DIAGNOSIS — H409 Unspecified glaucoma: Secondary | ICD-10-CM | POA: Insufficient documentation

## 2011-11-09 DIAGNOSIS — R29898 Other symptoms and signs involving the musculoskeletal system: Secondary | ICD-10-CM | POA: Insufficient documentation

## 2011-11-09 DIAGNOSIS — R5381 Other malaise: Secondary | ICD-10-CM | POA: Insufficient documentation

## 2011-11-09 DIAGNOSIS — E039 Hypothyroidism, unspecified: Secondary | ICD-10-CM | POA: Insufficient documentation

## 2011-11-09 DIAGNOSIS — I4891 Unspecified atrial fibrillation: Secondary | ICD-10-CM | POA: Insufficient documentation

## 2011-11-09 DIAGNOSIS — N390 Urinary tract infection, site not specified: Secondary | ICD-10-CM

## 2011-11-09 HISTORY — DX: Hypothyroidism, unspecified: E03.9

## 2011-11-09 HISTORY — DX: Unspecified atrial fibrillation: I48.91

## 2011-11-09 HISTORY — DX: Essential (primary) hypertension: I10

## 2011-11-09 HISTORY — DX: Malignant neoplasm of endometrium: C54.1

## 2011-11-09 HISTORY — DX: Transient cerebral ischemic attack, unspecified: G45.9

## 2011-11-09 LAB — URINE MICROSCOPIC-ADD ON

## 2011-11-09 LAB — CBC
Hemoglobin: 11.4 g/dL — ABNORMAL LOW (ref 12.0–15.0)
MCH: 30.6 pg (ref 26.0–34.0)
MCHC: 34 g/dL (ref 30.0–36.0)
MCV: 90.1 fL (ref 78.0–100.0)

## 2011-11-09 LAB — DIFFERENTIAL
Basophils Relative: 1 % (ref 0–1)
Eosinophils Absolute: 0.3 10*3/uL (ref 0.0–0.7)
Eosinophils Relative: 4 % (ref 0–5)
Monocytes Absolute: 0.6 10*3/uL (ref 0.1–1.0)
Monocytes Relative: 9 % (ref 3–12)
Neutrophils Relative %: 53 % (ref 43–77)

## 2011-11-09 LAB — BASIC METABOLIC PANEL
BUN: 30 mg/dL — ABNORMAL HIGH (ref 6–23)
Calcium: 10 mg/dL (ref 8.4–10.5)
Creatinine, Ser: 1.6 mg/dL — ABNORMAL HIGH (ref 0.50–1.10)
GFR calc Af Amer: 33 mL/min — ABNORMAL LOW (ref >90)
GFR calc non Af Amer: 29 mL/min — ABNORMAL LOW (ref >90)
Potassium: 4.2 mEq/L (ref 3.5–5.1)

## 2011-11-09 LAB — URINALYSIS, ROUTINE W REFLEX MICROSCOPIC
Bilirubin Urine: NEGATIVE
Ketones, ur: NEGATIVE mg/dL
Nitrite: NEGATIVE
Protein, ur: 100 mg/dL — AB
Urobilinogen, UA: 0.2 mg/dL (ref 0.0–1.0)

## 2011-11-09 MED ORDER — WARFARIN SODIUM 5 MG PO TABS
5.0000 mg | ORAL_TABLET | Freq: Once | ORAL | Status: AC
  Administered 2011-11-09: 5 mg via ORAL
  Filled 2011-11-09: qty 1

## 2011-11-09 MED ORDER — DIGOXIN 125 MCG PO TABS
0.1250 mg | ORAL_TABLET | Freq: Once | ORAL | Status: AC
  Administered 2011-11-09: 0.125 mg via ORAL
  Filled 2011-11-09: qty 1

## 2011-11-09 MED ORDER — SULFAMETHOXAZOLE-TMP DS 800-160 MG PO TABS
1.0000 | ORAL_TABLET | Freq: Once | ORAL | Status: AC
  Administered 2011-11-09: 1 via ORAL
  Filled 2011-11-09: qty 1

## 2011-11-09 MED ORDER — SULFAMETHOXAZOLE-TRIMETHOPRIM 800-160 MG PO TABS: 1.0000 | ORAL_TABLET | Freq: Two times a day (BID) | ORAL | Status: AC

## 2011-11-09 NOTE — ED Notes (Signed)
Pt reports she almost fell sautrday but caught herself on wall while shopping- states she feels shaky and "legs felt weak"- b/p elevated pta 237/108

## 2011-11-09 NOTE — ED Provider Notes (Signed)
History     CSN: [id number]  Arrival date & time 11/09/11  0106   First MD Initiated Contact with Patient 11/09/11 0135      Chief Complaint  Patient presents with  . Feels weak and shaky     (Consider location/radiation/quality/duration/timing/severity/associated sxs/prior treatment) HPI This is an 78 year old white female with a history of atrial fibrillation. She states her rate and blood pressure are usually well-controlled. 2 days ago she was shopping and felt her legs were weak and she fell forward against a wall. She contused her right periorbital region. She did not fall to the ground and was able to stand up and continuing ambulating. She had another episode yesterday evening at home where she felt her legs were weak and shaky. She took her blood pressure and found to be 203/117. She checked it again this morning and found it to be 237/108 and decided to be evaluated in the emergency department. She denies any focal neurologic deficit other than the transient leg weakness. She denies headache. She denies chest pain, dyspnea, abdominal pain, dysuria, nausea or vomiting. She has chronic urinary incontinence and chronic diarrhea which have not changed.  Past Medical History  Diagnosis Date  . Hypertension   . Diabetes mellitus   . Glaucoma   . Atrial fibrillation   . Hypothyroidism   . Endometrial cancer   . TIA (transient ischemic attack)     Past Surgical History  Procedure Date  . Abdominal hysterectomy   . Ankle surgery     No family history on file.  History  Substance Use Topics  . Smoking status: Never Smoker   . Smokeless tobacco: Never Used  . Alcohol Use: No    OB History    Grav Para Term Preterm Abortions TAB SAB Ect Mult Living                  Review of Systems  All other systems reviewed and are negative.    Allergies  Review of patient's allergies indicates no known allergies.  Home Medications  No current outpatient prescriptions on  file.  BP 230/81  Pulse 62  Temp(Src) 98 F (36.7 C) (Oral)  Resp 20  SpO2 96%  Physical Exam General: Well-developed, well-nourished female in no acute distress; appearance consistent with age of record HENT: normocephalic, superficial hematoma of the right lateral periorbital region Eyes: pupils equal round and reactive to light; extraocular muscles intact; slight arcus senilis bilateral Neck: supple Heart: Irregular rhythm Lungs: clear to auscultation bilaterally Abdomen: soft; nondistended; nontender; no masses or hepatosplenomegaly; bowel sounds present Extremities: No deformity; full range of motion; pulses normal; trace edema of lower legs Neurologic: Awake, alert and oriented; motor function intact in all extremities and symmetric; no facial droop Skin: Warm and dry Psychiatric: Normal mood and affect    ED Course  Procedures (including critical care time)     MDM  EKG Interpretation:  Date & Time: 11/09/2011 1:53 AM  Rate: 77  Rhythm: atrial fibrillation  QRS Axis: normal  Intervals: normal  ST/T Wave abnormalities: nonspecific ST/T changes  Conduction Disutrbances:none  Narrative Interpretation:   Old EKG Reviewed: No significant changes  Nursing notes and vitals signs, including pulse oximetry, reviewed.  Summary of this visit's results, reviewed by myself:  Labs:  Results for orders placed during the hospital encounter of 11/09/11  CBC      Component Value Range   WBC 6.8  4.0 - 10.5 (K/uL)   RBC 3.72 (*)  3.87 - 5.11 (MIL/uL)   Hemoglobin 11.4 (*) 12.0 - 15.0 (g/dL)   HCT 45.4 (*) 09.8 - 46.0 (%)   MCV 90.1  78.0 - 100.0 (fL)   MCH 30.6  26.0 - 34.0 (pg)   MCHC 34.0  30.0 - 36.0 (g/dL)   RDW 11.9  14.7 - 82.9 (%)   Platelets 163  150 - 400 (K/uL)  DIFFERENTIAL      Component Value Range   Neutrophils Relative 53  43 - 77 (%)   Neutro Abs 3.6  1.7 - 7.7 (K/uL)   Lymphocytes Relative 33  12 - 46 (%)   Lymphs Abs 2.2  0.7 - 4.0 (K/uL)    Monocytes Relative 9  3 - 12 (%)   Monocytes Absolute 0.6  0.1 - 1.0 (K/uL)   Eosinophils Relative 4  0 - 5 (%)   Eosinophils Absolute 0.3  0.0 - 0.7 (K/uL)   Basophils Relative 1  0 - 1 (%)   Basophils Absolute 0.1  0.0 - 0.1 (K/uL)  BASIC METABOLIC PANEL      Component Value Range   Sodium 138  135 - 145 (mEq/L)   Potassium 4.2  3.5 - 5.1 (mEq/L)   Chloride 99  96 - 112 (mEq/L)   CO2 26  19 - 32 (mEq/L)   Glucose, Bld 202 (*) 70 - 99 (mg/dL)   BUN 30 (*) 6 - 23 (mg/dL)   Creatinine, Ser 5.62 (*) 0.50 - 1.10 (mg/dL)   Calcium 13.0  8.4 - 10.5 (mg/dL)   GFR calc non Af Amer 29 (*) >90 (mL/min)   GFR calc Af Amer 33 (*) >90 (mL/min)  URINALYSIS, ROUTINE W REFLEX MICROSCOPIC      Component Value Range   Color, Urine YELLOW  YELLOW    APPearance CLEAR  CLEAR    Specific Gravity, Urine 1.011  1.005 - 1.030    pH 6.0  5.0 - 8.0    Glucose, UA 100 (*) NEGATIVE (mg/dL)   Hgb urine dipstick TRACE (*) NEGATIVE    Bilirubin Urine NEGATIVE  NEGATIVE    Ketones, ur NEGATIVE  NEGATIVE (mg/dL)   Protein, ur 865 (*) NEGATIVE (mg/dL)   Urobilinogen, UA 0.2  0.0 - 1.0 (mg/dL)   Nitrite NEGATIVE  NEGATIVE    Leukocytes, UA SMALL (*) NEGATIVE   TROPONIN I      Component Value Range   Troponin I <0.30  <0.30 (ng/mL)  DIGOXIN LEVEL      Component Value Range   Digoxin Level 0.7 (*) 0.8 - 2.0 (ng/mL)  PROTIME-INR      Component Value Range   Prothrombin Time 17.1 (*) 11.6 - 15.2 (seconds)   INR 1.37  0.00 - 1.49   URINE MICROSCOPIC-ADD ON      Component Value Range   Squamous Epithelial / LPF FEW (*) RARE    WBC, UA 21-50  <3 (WBC/hpf)   RBC / HPF 3-6  <3 (RBC/hpf)   Bacteria, UA MANY (*) RARE    Casts HYALINE CASTS (*) NEGATIVE    We'll treat for urinary tract infection. Advise patient of low INR and digoxin levels. She will followup with her primary care physician for reevaluation of her hypertension.            Hanley Seamen, MD 11/09/11 [phone]

## 2011-11-11 LAB — URINE CULTURE

## 2011-11-12 NOTE — ED Notes (Signed)
+  Urine. Patient treated with Septra. Sensitive to same. Per protocol MD. °

## 2012-06-24 DIAGNOSIS — W19XXXA Unspecified fall, initial encounter: Secondary | ICD-10-CM

## 2012-06-24 DIAGNOSIS — Y92009 Unspecified place in unspecified non-institutional (private) residence as the place of occurrence of the external cause: Secondary | ICD-10-CM

## 2012-06-24 HISTORY — DX: Unspecified fall, initial encounter: W19.XXXA

## 2012-06-24 HISTORY — DX: Unspecified fall, initial encounter: Y92.009

## 2012-06-29 ENCOUNTER — Emergency Department (HOSPITAL_COMMUNITY): Payer: Medicare Other

## 2012-06-29 ENCOUNTER — Inpatient Hospital Stay (HOSPITAL_COMMUNITY)
Admission: EM | Admit: 2012-06-29 | Discharge: 2012-07-03 | DRG: 291 | Disposition: A | Discharge status: Home / Self Care | Payer: Medicare Other | Attending: Internal Medicine | Admitting: Internal Medicine

## 2012-06-29 ENCOUNTER — Encounter (HOSPITAL_COMMUNITY): Payer: Self-pay | Admitting: *Deleted

## 2012-06-29 DIAGNOSIS — Z23 Encounter for immunization: Secondary | ICD-10-CM

## 2012-06-29 DIAGNOSIS — E039 Hypothyroidism, unspecified: Secondary | ICD-10-CM | POA: Diagnosis present

## 2012-06-29 DIAGNOSIS — H409 Unspecified glaucoma: Secondary | ICD-10-CM | POA: Diagnosis present

## 2012-06-29 DIAGNOSIS — J189 Pneumonia, unspecified organism: Secondary | ICD-10-CM

## 2012-06-29 DIAGNOSIS — E669 Obesity, unspecified: Secondary | ICD-10-CM | POA: Diagnosis present

## 2012-06-29 DIAGNOSIS — E781 Pure hyperglyceridemia: Secondary | ICD-10-CM | POA: Diagnosis present

## 2012-06-29 DIAGNOSIS — IMO0001 Reserved for inherently not codable concepts without codable children: Secondary | ICD-10-CM | POA: Diagnosis present

## 2012-06-29 DIAGNOSIS — Z794 Long term (current) use of insulin: Secondary | ICD-10-CM

## 2012-06-29 DIAGNOSIS — Z6836 Body mass index (BMI) 36.0-36.9, adult: Secondary | ICD-10-CM

## 2012-06-29 DIAGNOSIS — E785 Hyperlipidemia, unspecified: Secondary | ICD-10-CM | POA: Diagnosis present

## 2012-06-29 DIAGNOSIS — N390 Urinary tract infection, site not specified: Secondary | ICD-10-CM | POA: Diagnosis present

## 2012-06-29 DIAGNOSIS — Z79899 Other long term (current) drug therapy: Secondary | ICD-10-CM

## 2012-06-29 DIAGNOSIS — G4733 Obstructive sleep apnea (adult) (pediatric): Secondary | ICD-10-CM | POA: Diagnosis present

## 2012-06-29 DIAGNOSIS — I129 Hypertensive chronic kidney disease with stage 1 through stage 4 chronic kidney disease, or unspecified chronic kidney disease: Secondary | ICD-10-CM | POA: Diagnosis present

## 2012-06-29 DIAGNOSIS — I10 Essential (primary) hypertension: Secondary | ICD-10-CM

## 2012-06-29 DIAGNOSIS — I509 Heart failure, unspecified: Secondary | ICD-10-CM

## 2012-06-29 DIAGNOSIS — E8779 Other fluid overload: Secondary | ICD-10-CM | POA: Diagnosis present

## 2012-06-29 DIAGNOSIS — J9600 Acute respiratory failure, unspecified whether with hypoxia or hypercapnia: Secondary | ICD-10-CM | POA: Diagnosis present

## 2012-06-29 DIAGNOSIS — N183 Chronic kidney disease, stage 3 unspecified: Secondary | ICD-10-CM | POA: Diagnosis present

## 2012-06-29 DIAGNOSIS — Z7982 Long term (current) use of aspirin: Secondary | ICD-10-CM

## 2012-06-29 DIAGNOSIS — D649 Anemia, unspecified: Secondary | ICD-10-CM | POA: Diagnosis present

## 2012-06-29 DIAGNOSIS — I4891 Unspecified atrial fibrillation: Secondary | ICD-10-CM | POA: Diagnosis present

## 2012-06-29 DIAGNOSIS — E119 Type 2 diabetes mellitus without complications: Secondary | ICD-10-CM | POA: Diagnosis present

## 2012-06-29 DIAGNOSIS — Z8673 Personal history of transient ischemic attack (TIA), and cerebral infarction without residual deficits: Secondary | ICD-10-CM

## 2012-06-29 DIAGNOSIS — Z7901 Long term (current) use of anticoagulants: Secondary | ICD-10-CM

## 2012-06-29 DIAGNOSIS — Z8542 Personal history of malignant neoplasm of other parts of uterus: Secondary | ICD-10-CM

## 2012-06-29 DIAGNOSIS — R112 Nausea with vomiting, unspecified: Secondary | ICD-10-CM | POA: Diagnosis not present

## 2012-06-29 DIAGNOSIS — I5033 Acute on chronic diastolic (congestive) heart failure: Principal | ICD-10-CM | POA: Diagnosis present

## 2012-06-29 HISTORY — DX: Obstructive sleep apnea (adult) (pediatric): G47.33

## 2012-06-29 HISTORY — DX: Pneumonia, unspecified organism: J18.9

## 2012-06-29 HISTORY — DX: Unspecified osteoarthritis, unspecified site: M19.90

## 2012-06-29 HISTORY — DX: Pure hypercholesterolemia, unspecified: E78.00

## 2012-06-29 HISTORY — DX: Unspecified fall, initial encounter: W19.XXXA

## 2012-06-29 HISTORY — DX: Personal history of other diseases of the digestive system: Z87.19

## 2012-06-29 HISTORY — DX: Type 2 diabetes mellitus without complications: E11.9

## 2012-06-29 HISTORY — DX: Heart failure, unspecified: I50.9

## 2012-06-29 HISTORY — DX: Unspecified place in unspecified non-institutional (private) residence as the place of occurrence of the external cause: Y92.009

## 2012-06-29 HISTORY — DX: Dependence on other enabling machines and devices: Z99.89

## 2012-06-29 HISTORY — DX: Shortness of breath: R06.02

## 2012-06-29 HISTORY — DX: Gastro-esophageal reflux disease without esophagitis: K21.9

## 2012-06-29 LAB — CBC WITH DIFFERENTIAL/PLATELET
Eosinophils Absolute: 0.2 10*3/uL (ref 0.0–0.7)
Hemoglobin: 9.6 g/dL — ABNORMAL LOW (ref 12.0–15.0)
Lymphocytes Relative: 14 % (ref 12–46)
Lymphs Abs: 1.2 10*3/uL (ref 0.7–4.0)
Monocytes Relative: 10 % (ref 3–12)
Neutro Abs: 6.3 10*3/uL (ref 1.7–7.7)
Neutrophils Relative %: 72 % (ref 43–77)
Platelets: 267 10*3/uL (ref 150–400)
RBC: 3.38 MIL/uL — ABNORMAL LOW (ref 3.87–5.11)
WBC: 8.7 10*3/uL (ref 4.0–10.5)

## 2012-06-29 LAB — COMPREHENSIVE METABOLIC PANEL
ALT: 17 U/L (ref 0–35)
Alkaline Phosphatase: 45 U/L (ref 39–117)
CO2: 21 mEq/L (ref 19–32)
Chloride: 99 mEq/L (ref 96–112)
GFR calc Af Amer: 26 mL/min — ABNORMAL LOW (ref >90)
Glucose, Bld: 278 mg/dL — ABNORMAL HIGH (ref 70–99)
Potassium: 4.5 mEq/L (ref 3.5–5.1)
Sodium: 135 mEq/L (ref 135–145)
Total Bilirubin: 0.4 mg/dL (ref 0.3–1.2)
Total Protein: 7.8 g/dL (ref 6.0–8.3)

## 2012-06-29 LAB — PRO B NATRIURETIC PEPTIDE: Pro B Natriuretic peptide (BNP): 4620 pg/mL — ABNORMAL HIGH (ref 0–450)

## 2012-06-29 LAB — PROTIME-INR: Prothrombin Time: 14 seconds (ref 11.6–15.2)

## 2012-06-29 LAB — GLUCOSE, CAPILLARY: Glucose-Capillary: 277 mg/dL — ABNORMAL HIGH (ref 70–99)

## 2012-06-29 MED ORDER — METOPROLOL SUCCINATE ER 50 MG PO TB24
50.0000 mg | ORAL_TABLET | Freq: Every day | ORAL | Status: DC
  Administered 2012-06-30 – 2012-07-03 (×4): 50 mg via ORAL
  Filled 2012-06-29 (×4): qty 1

## 2012-06-29 MED ORDER — CALCIUM CARBONATE-VITAMIN D 600-400 MG-UNIT PO TABS: 600.0000 mg | ORAL_TABLET | Freq: Every day | ORAL | Status: DC

## 2012-06-29 MED ORDER — AMLODIPINE BESYLATE 10 MG PO TABS
10.0000 mg | ORAL_TABLET | Freq: Every day | ORAL | Status: DC
  Administered 2012-06-30: 10 mg via ORAL
  Filled 2012-06-29: qty 1

## 2012-06-29 MED ORDER — WARFARIN SODIUM 10 MG PO TABS
10.0000 mg | ORAL_TABLET | Freq: Once | ORAL | Status: AC
  Administered 2012-06-29: 10 mg via ORAL
  Filled 2012-06-29 (×2): qty 1

## 2012-06-29 MED ORDER — CHLORDIAZEPOXIDE HCL 5 MG PO CAPS: 5.0000 mg | ORAL_CAPSULE | Freq: Two times a day (BID) | ORAL | Status: DC | PRN

## 2012-06-29 MED ORDER — CYANOCOBALAMIN 500 MCG PO TABS
500.0000 ug | ORAL_TABLET | Freq: Every day | ORAL | Status: DC
  Administered 2012-06-30 – 2012-07-03 (×4): 500 ug via ORAL
  Filled 2012-06-29 (×5): qty 1

## 2012-06-29 MED ORDER — INSULIN ASPART 100 UNIT/ML ~~LOC~~ SOLN
0.0000 [IU] | Freq: Three times a day (TID) | SUBCUTANEOUS | Status: DC
  Administered 2012-06-30 (×2): 7 [IU] via SUBCUTANEOUS

## 2012-06-29 MED ORDER — WARFARIN - PHARMACIST DOSING INPATIENT: Freq: Every day | Status: DC

## 2012-06-29 MED ORDER — ASPIRIN EC 81 MG PO TBEC
81.0000 mg | DELAYED_RELEASE_TABLET | Freq: Every day | ORAL | Status: DC
  Administered 2012-06-30 – 2012-07-03 (×4): 81 mg via ORAL
  Filled 2012-06-29 (×4): qty 1

## 2012-06-29 MED ORDER — CYCLOSPORINE 0.05 % OP EMUL
1.0000 [drp] | Freq: Two times a day (BID) | OPHTHALMIC | Status: DC
  Administered 2012-06-29: 1 [drp] via OPHTHALMIC
  Administered 2012-06-30: 21:00:00 via OPHTHALMIC
  Administered 2012-06-30 – 2012-07-03 (×6): 1 [drp] via OPHTHALMIC
  Filled 2012-06-29 (×9): qty 1

## 2012-06-29 MED ORDER — ACETAMINOPHEN 325 MG PO TABS
650.0000 mg | ORAL_TABLET | ORAL | Status: DC | PRN
  Administered 2012-06-30 – 2012-07-03 (×3): 650 mg via ORAL
  Filled 2012-06-29 (×3): qty 2

## 2012-06-29 MED ORDER — BRIMONIDINE TARTRATE 0.1 % OP SOLN: 1.0000 [drp] | Freq: Two times a day (BID) | OPHTHALMIC | Status: DC

## 2012-06-29 MED ORDER — FUROSEMIDE 10 MG/ML IJ SOLN
40.0000 mg | Freq: Once | INTRAMUSCULAR | Status: AC
  Administered 2012-06-29: 40 mg via INTRAVENOUS
  Filled 2012-06-29: qty 4

## 2012-06-29 MED ORDER — DIGOXIN 125 MCG PO TABS
125.0000 ug | ORAL_TABLET | Freq: Every day | ORAL | Status: DC
  Administered 2012-06-30 – 2012-07-03 (×4): 125 ug via ORAL
  Filled 2012-06-29 (×4): qty 1

## 2012-06-29 MED ORDER — BIMATOPROST 0.01 % OP SOLN
1.0000 [drp] | Freq: Every day | OPHTHALMIC | Status: DC
  Administered 2012-06-29 – 2012-07-02 (×4): 1 [drp] via OPHTHALMIC
  Filled 2012-06-29: qty 2.5

## 2012-06-29 MED ORDER — SODIUM CHLORIDE 0.9 % IV SOLN: 250.0000 mL | INTRAVENOUS | Status: DC | PRN

## 2012-06-29 MED ORDER — POLYVINYL ALCOHOL 1.4 % OP SOLN
1.0000 [drp] | Freq: Four times a day (QID) | OPHTHALMIC | Status: DC
  Administered 2012-06-29 – 2012-07-03 (×14): 1 [drp] via OPHTHALMIC
  Filled 2012-06-29: qty 15

## 2012-06-29 MED ORDER — FUROSEMIDE 10 MG/ML IJ SOLN
80.0000 mg | Freq: Two times a day (BID) | INTRAMUSCULAR | Status: DC
  Administered 2012-06-30 (×2): 80 mg via INTRAVENOUS
  Filled 2012-06-29 (×2): qty 8

## 2012-06-29 MED ORDER — INSULIN GLARGINE 100 UNIT/ML ~~LOC~~ SOLN
25.0000 [IU] | Freq: Every day | SUBCUTANEOUS | Status: DC
  Administered 2012-06-29: 25 [IU] via SUBCUTANEOUS

## 2012-06-29 MED ORDER — HEPARIN SODIUM (PORCINE) 5000 UNIT/ML IJ SOLN
5000.0000 [IU] | Freq: Three times a day (TID) | INTRAMUSCULAR | Status: DC
  Administered 2012-06-29 – 2012-07-03 (×11): 5000 [IU] via SUBCUTANEOUS
  Filled 2012-06-29 (×14): qty 1

## 2012-06-29 MED ORDER — ASPIRIN 81 MG PO TABS: 81.0000 mg | ORAL_TABLET | Freq: Every day | ORAL | Status: DC

## 2012-06-29 MED ORDER — WARFARIN SODIUM 10 MG PO TABS: 10.0000 mg | ORAL_TABLET | Freq: Once | ORAL | Status: DC

## 2012-06-29 MED ORDER — BRIMONIDINE TARTRATE 0.2 % OP SOLN
1.0000 [drp] | Freq: Two times a day (BID) | OPHTHALMIC | Status: DC
  Administered 2012-06-29 – 2012-07-03 (×8): 1 [drp] via OPHTHALMIC
  Filled 2012-06-29: qty 5

## 2012-06-29 MED ORDER — INFLUENZA VIRUS VACC SPLIT PF IM SUSP
0.5000 mL | INTRAMUSCULAR | Status: AC
  Administered 2012-06-30: 0.5 mL via INTRAMUSCULAR
  Filled 2012-06-29: qty 0.5

## 2012-06-29 MED ORDER — POTASSIUM CHLORIDE ER 10 MEQ PO TBCR
10.0000 meq | EXTENDED_RELEASE_TABLET | Freq: Every day | ORAL | Status: DC
  Administered 2012-06-30 – 2012-07-03 (×4): 10 meq via ORAL
  Filled 2012-06-29 (×4): qty 1

## 2012-06-29 MED ORDER — OMEGA-3-ACID ETHYL ESTERS 1 G PO CAPS
2.0000 g | ORAL_CAPSULE | Freq: Two times a day (BID) | ORAL | Status: DC
  Administered 2012-06-29 – 2012-07-03 (×8): 2 g via ORAL
  Filled 2012-06-29 (×9): qty 2

## 2012-06-29 MED ORDER — CALCIUM CARBONATE-VITAMIN D 500-200 MG-UNIT PO TABS
1.0000 | ORAL_TABLET | Freq: Every day | ORAL | Status: DC
  Administered 2012-06-30 – 2012-07-03 (×4): 1 via ORAL
  Filled 2012-06-29 (×4): qty 1

## 2012-06-29 MED ORDER — POLYETHYL GLYCOL-PROPYL GLYCOL 0.4-0.3 % OP SOLN: 1.0000 [drp] | Freq: Four times a day (QID) | OPHTHALMIC | Status: DC

## 2012-06-29 MED ORDER — ONDANSETRON HCL 4 MG/2ML IJ SOLN
4.0000 mg | Freq: Four times a day (QID) | INTRAMUSCULAR | Status: DC | PRN
  Administered 2012-07-01: 4 mg via INTRAVENOUS
  Filled 2012-06-29: qty 2

## 2012-06-29 MED ORDER — SODIUM CHLORIDE 0.9 % IJ SOLN
3.0000 mL | Freq: Two times a day (BID) | INTRAMUSCULAR | Status: DC
  Administered 2012-06-29 – 2012-07-03 (×8): 3 mL via INTRAVENOUS

## 2012-06-29 MED ORDER — BIOTIN 5000 MCG PO TABS: 500.0000 mg | ORAL_TABLET | Freq: Every day | ORAL | Status: DC

## 2012-06-29 MED ORDER — INSULIN ASPART 100 UNIT/ML ~~LOC~~ SOLN
0.0000 [IU] | Freq: Every day | SUBCUTANEOUS | Status: DC
  Administered 2012-06-29: 3 [IU] via SUBCUTANEOUS

## 2012-06-29 MED ORDER — LEVOTHYROXINE SODIUM 50 MCG PO TABS
50.0000 ug | ORAL_TABLET | Freq: Every day | ORAL | Status: DC
  Administered 2012-06-30 – 2012-07-03 (×4): 50 ug via ORAL
  Filled 2012-06-29 (×6): qty 1

## 2012-06-29 MED ORDER — SODIUM CHLORIDE 0.9 % IJ SOLN: 3.0000 mL | INTRAMUSCULAR | Status: DC | PRN

## 2012-06-29 NOTE — ED Notes (Signed)
PT is here with increased sob, chest pressure, and heart racing with exertion.  Cough.  Pt has history of atrial fib and is on coumadin.  [name] is cards.

## 2012-06-29 NOTE — Progress Notes (Signed)

## 2012-06-29 NOTE — H&P (Addendum)
[facility] History and Physical  [name] ZOX:[id number] DOB: 1926/02/13 DOA: 06/29/2012   PCP: Aida Puffer, MD   Chief Complaint: dyspnea  HPI:  Female with a history of diabetes, diastolic CHF, atrial fibrillation presents with three-week history of shortness of breath and peripheral edema that has worsened in the past 2 days. The patient had some subjective fevers, but did not take her temperature. There were no chills or rigors. The patient denies any coughing, hemoptysis, dysuria, hematuria, abdominal pain, dizziness chest pain. She has not had any orthopnea or PND. The patient wears CPAP at night. She does not know her settings.  Of note, the patient states that her primary care provider decreased her Bumex 2 mg twice a day to Lasix 40 mg twice a day approximately 3 weeks ago. He was during this period of time, the patient states that she has had increasing shortness of breath and peripheral edema. The patient states that she has been compliant with her medications. She does not weigh herself daily. Denies weight loss, hematochezia, melena.  She states that her appetite has been poor in the past few weeks. Assessment/Plan: Acute CHF exacerbation -likely precipitated by decrease in diuretic potency when Bumex 2mg  bid was decreased to furosemide 40mg  bid -No echocardiogram on record -Will order echo -Furosemide 80 mg IV every 12 hours -Fluid restriction, daily weights -Continue metoprolol succinate -Hold Aldactone do to renal insufficiency -Check TSH -Continue digoxin Diabetes mellitus type 2 -Give half home dose of Lantus due to poor appetite -NovoLog sliding scale -Discontinue metformin -Check morning lipids Hypertension -Discontinue clonidine -Suspect a degree of rebound hypertension as the patient was only taking 0.1 mg daily -Continue metoprolol succinate and amlodipine Hyperlipidemia Continue Lovaza Normocytic anemia -baseline hemoglobin 10-12 over past  2 years -check hemoccult Atrial fibrillation -rate controlled--continue metoprolol -continue coumadin for now -check hemoccult        Past Medical History  Diagnosis Date  . Hypertension   . Diabetes mellitus   . Glaucoma(365)   . Atrial fibrillation   . Hypothyroidism   . Endometrial cancer   . TIA (transient ischemic attack)   . Sleep apnea     CPAP at nite   Past Surgical History  Procedure Date  . Abdominal hysterectomy   . Ankle surgery    Social History:  reports that she has never smoked. She has never used smokeless tobacco. She reports that she does not drink alcohol or use illicit drugs.   Family history reviewed: No pertinent family history  No Known Allergies    Prior to Admission medications   Medication Sig Start Date End Date Taking? Authorizing Provider  amLODipine (NORVASC) 10 MG tablet Take 10 mg by mouth daily.   Yes Historical Provider, MD  aspirin 81 MG tablet Take 81 mg by mouth daily.   Yes Historical Provider, MD  bimatoprost (LUMIGAN) 0.01 % SOLN Place 1 drop into both eyes at bedtime.   Yes Historical Provider, MD  Biotin 5000 MCG TABS Take by mouth daily.   Yes Historical Provider, MD  brimonidine (ALPHAGAN P) 0.1 % SOLN Place 1 drop into both eyes every 12 (twelve) hours.   Yes Historical Provider, MD  Calcium Carbonate-Vitamin D (CALCIUM 600 + D PO) Take 1 tablet by mouth daily.   Yes Historical Provider, MD  chlordiazePOXIDE (LIBRIUM) 5 MG capsule Take 5 mg by mouth 2 (two) times daily as needed.   Yes Historical Provider, MD  cloNIDine (CATAPRES) 0.1 MG tablet Take 0.1 mg by  mouth daily.   Yes Historical Provider, MD  cyanocobalamin 500 MCG tablet Take 500 mcg by mouth daily.   Yes Historical Provider, MD  cycloSPORINE (RESTASIS) 0.05 % ophthalmic emulsion Place 1 drop into both eyes every 12 (twelve) hours.   Yes Historical Provider, MD  digoxin (LANOXIN) 0.125 MG tablet Take 125 mcg by mouth daily.   Yes Historical Provider, MD    ergocalciferol (VITAMIN D2) 50000 UNITS capsule Take 50,000 Units by mouth once a week. On Saturdays   Yes Historical Provider, MD  furosemide (LASIX) 40 MG tablet Take 40 mg by mouth 2 (two) times daily.   Yes Historical Provider, MD  Insulin Aspart (NOVOLOG FLEXPEN [place]) Inject into the skin 3 (three) times daily before meals. 10 units before breakfast, 15 units before lunch, 25 units before dinner   Yes Historical Provider, MD  insulin glargine (LANTUS SOLOSTAR) 100 UNIT/ML injection Inject 50 Units into the skin daily.    Yes Historical Provider, MD  levothyroxine (SYNTHROID, LEVOTHROID) 50 MCG tablet Take 50 mcg by mouth daily.   Yes Historical Provider, MD  metFORMIN (GLUCOPHAGE) 500 MG tablet Take 500 mg by mouth 2 (two) times daily with a meal.   Yes Historical Provider, MD  metoprolol succinate (TOPROL-XL) 50 MG 24 hr tablet Take 50 mg by mouth daily. Take with or immediately following a meal.   Yes Historical Provider, MD  Multiple Vitamins-Minerals (CENTRUM SILVER PO) Take 1 tablet by mouth daily.   Yes Historical Provider, MD  omega-3 acid ethyl esters (LOVAZA) 1 G capsule Take 2 g by mouth 2 (two) times daily.   Yes Historical Provider, MD  Polyethyl Glycol-Propyl Glycol (SYSTANE OP) Place 1-4 drops into both eyes 4 (four) times daily as needed. For dry eyes   Yes Historical Provider, MD  potassium chloride (K-DUR) 10 MEQ tablet Take 10 mEq by mouth daily.   Yes Historical Provider, MD  spironolactone (ALDACTONE) 25 MG tablet Take 25 mg by mouth daily.   Yes Historical Provider, MD  warfarin (COUMADIN) 5 MG tablet Take 5-7.5 mg by mouth every evening. Mon & Fri-1 tab (5 mg total) ; All other days-1.5 tabs (7.5 mg total)   Yes Historical Provider, MD    Review of Systems:  Constitutional:  No weight loss, night sweats,fatigue.  Head&Eyes: No headache.  No vision loss.  No eye pain or scotoma ENT:  No Difficulty swallowing,Tooth/dental problems,Sore throat,  No ear ache, post nasal  drip,  Cardio-vascular:  No chest pain, Orthopnea, PND  dizziness, palpitations  GI:  No  abdominal pain, nausea, vomiting, diarrhea, loss of appetite, hematochezia, melena, heartburn, indigestion, Resp:  No shortness of breath with exertion or at rest. No cough. No coughing up of blood .No wheezing.No chest wall deformity  Skin:  no rash or lesions.  GU:  no dysuria, change in color of urine, no urgency or frequency. No flank pain.  Musculoskeletal:  No joint pain or swelling. No decreased range of motion. No back pain.  Psych:  No change in mood or affect. No depression or anxiety. Neurologic: No headache, no dysesthesia, no focal weakness, no vision loss. No syncope  Physical Exam: Filed Vitals:   06/29/12 1744 06/29/12 1815 06/29/12 1849 06/29/12 1849  BP: 167/49 169/50 151/107 149/98  Pulse: 86 78 82 84  Temp:      TempSrc:      Resp: 18 26 18 20   SpO2: 92% 89% 95% 92%   General:  A&O x 3, NAD, nontoxic, pleasant/cooperative  Head/Eye: No conjunctival hemorrhage, no icterus, [place]/AT, No nystagmus ENT:  No icterus,  No thrush, good dentition, no pharyngeal exudate Neck:  No masses, no lymphadenpathy, no bruits CV: I RRR, no rub, no gallop, no S3 Lung:  Bilateral crackles. Good air movement. No wheezes. Abdomen: soft/NT, +BS, nondistended, no peritoneal signs Ext: No cyanosis, No rashes, No petechiae, No lymphangitis, 3+ edema   Labs on Admission:  Basic Metabolic Panel:  Lab 06/29/12 1610  NA 135  K 4.5  CL 99  CO2 21  GLUCOSE 278*  BUN 45*  CREATININE 1.92*  CALCIUM 9.3  MG --  PHOS --   Liver Function Tests:  Lab 06/29/12 1610  AST 19  ALT 17  ALKPHOS 45  BILITOT 0.4  PROT 7.8  ALBUMIN 3.1*   No results found for this basename: LIPASE:5,AMYLASE:5 in the last 168 hours No results found for this basename: AMMONIA:5 in the last 168 hours CBC:  Lab 06/29/12 1610  WBC 8.7  NEUTROABS 6.3  HGB 9.6*  HCT 29.7*  MCV 87.9  PLT 267   Cardiac  Enzymes: No results found for this basename: CKTOTAL:5,CKMB:5,CKMBINDEX:5,TROPONINI:5 in the last 168 hours BNP: No components found with this basename: POCBNP:5 CBG: No results found for this basename: GLUCAP:5 in the last 168 hours  Radiological Exams on Admission: Dg Chest 2 View  06/29/2012  *RADIOLOGY REPORT*  Clinical Data: Shortness of breath.  CHEST - 2 VIEW  Comparison: Chest x-ray 03/24/2010.  Findings: Compared to the prior examination and there is a new multifocal bilateral air space disease which is predominately perihilar (left greater than right) in location.  There is some associated cephalization of the pulmonary vasculature, indistinctness of the interstitial markings and Kerley B lines, suggestive that these findings may be related to pulmonary edema. Trace bilateral pleural effusions.  Heart size is mildly enlarged. The patient is rotated to the left on today's exam, resulting in distortion of the mediastinal contours and reduced diagnostic sensitivity and specificity for mediastinal pathology. Atherosclerosis of the thoracic aorta.  IMPRESSION: 1.  Findings, as above, concerning for congestive heart failure. 2.  Given the relatively extensive airspace disease that is slightly asymmetric involving the left upper lobe to a greater extent than the remainder of the lungs, the possibility of superimposed acute infection is not excluded.  Clinical correlation is recommended. 3.  Atherosclerosis.   Original Report Authenticated By: Trudie Reed, M.D.     EKG: Independently reviewed. Afib, 89    Time spent:70 minutes Code Status:   full Family Communication:   daughter at bedside   Aron Needles, DO  [facility] Pager [phone]  If 7PM-7AM, please contact night-coverage [email or web address] Password [email or web address] 06/29/2012, 7:01 PM

## 2012-06-29 NOTE — ED Provider Notes (Signed)
History     CSN: [id number]  Arrival date & time 06/29/12  1543   First MD Initiated Contact with Patient 06/29/12 1725      Chief Complaint  Patient presents with  . Shortness of Breath  . Heart racing     (Consider location/radiation/quality/duration/timing/severity/associated sxs/prior treatment) The history is provided by the patient.  [name] is a 78 y.o. female hx of HTN, DM, CHF afib on coumadin here with SOB. Worsen SOB over the last 3 days. She noticed SOB when she ambulates and now has hard time walking to the bathroom. Denies PND but she is on cpap at night. Her legs have been swollen for several weeks. She was recently started on lasix but it hasn't helped. No fevers, + nonproductive cough.   Past Medical History  Diagnosis Date  . Hypertension   . Diabetes mellitus   . Glaucoma(365)   . Atrial fibrillation   . Hypothyroidism   . Endometrial cancer   . TIA (transient ischemic attack)   . Sleep apnea     CPAP at nite    Past Surgical History  Procedure Date  . Abdominal hysterectomy   . Ankle surgery     No family history on file.  History  Substance Use Topics  . Smoking status: Never Smoker   . Smokeless tobacco: Never Used  . Alcohol Use: No    OB History    Grav Para Term Preterm Abortions TAB SAB Ect Mult Living                  Review of Systems  Respiratory: Positive for cough and shortness of breath.   Cardiovascular: Positive for leg swelling.  All other systems reviewed and are negative.    Allergies  Review of patient's allergies indicates no known allergies.  Home Medications   Current Outpatient Rx  Name  Route  Sig  Dispense  Refill  . AMLODIPINE BESYLATE 10 MG PO TABS   Oral   Take 10 mg by mouth daily.         . ASPIRIN 81 MG PO TABS   Oral   Take 81 mg by mouth daily.         Marland Kitchen BIMATOPROST 0.01 % OP SOLN   Both Eyes   Place 1 drop into both eyes at bedtime.         Marland Kitchen BIOTIN 5000 MCG PO TABS   Oral   Take by mouth daily.         Marland Kitchen BRIMONIDINE TARTRATE 0.1 % OP SOLN   Both Eyes   Place 1 drop into both eyes every 12 (twelve) hours.         Marland Kitchen CALCIUM 600 + D PO   Oral   Take 1 tablet by mouth daily.         . CHLORDIAZEPOXIDE HCL 5 MG PO CAPS   Oral   Take 5 mg by mouth 2 (two) times daily as needed.         Marland Kitchen CLONIDINE HCL 0.1 MG PO TABS   Oral   Take 0.1 mg by mouth daily.         . CYANOCOBALAMIN 500 MCG PO TABS   Oral   Take 500 mcg by mouth daily.         . CYCLOSPORINE 0.05 % OP EMUL   Both Eyes   Place 1 drop into both eyes every 12 (twelve) hours.         Marland Kitchen  DIGOXIN 0.125 MG PO TABS   Oral   Take 125 mcg by mouth daily.         . ERGOCALCIFEROL 50000 UNITS PO CAPS   Oral   Take 50,000 Units by mouth once a week. On Saturdays         . FUROSEMIDE 40 MG PO TABS   Oral   Take 40 mg by mouth 2 (two) times daily.         Marland Kitchen NOVOLOG FLEXPEN [place]   Subcutaneous   Inject into the skin 3 (three) times daily before meals. 10 units before breakfast, 15 units before lunch, 25 units before dinner         . INSULIN GLARGINE 100 UNIT/ML [place] SOLN   Subcutaneous   Inject 50 Units into the skin daily.          Marland Kitchen LEVOTHYROXINE SODIUM 50 MCG PO TABS   Oral   Take 50 mcg by mouth daily.         Marland Kitchen METFORMIN HCL 500 MG PO TABS   Oral   Take 500 mg by mouth 2 (two) times daily with a meal.         . METOPROLOL SUCCINATE ER 50 MG PO TB24   Oral   Take 50 mg by mouth daily. Take with or immediately following a meal.         . CENTRUM SILVER PO   Oral   Take 1 tablet by mouth daily.         . OMEGA-3-ACID ETHYL ESTERS 1 G PO CAPS   Oral   Take 2 g by mouth 2 (two) times daily.         [place] OP   Both Eyes   Place 1-4 drops into both eyes 4 (four) times daily as needed. For dry eyes         . POTASSIUM CHLORIDE ER 10 MEQ PO TBCR   Oral   Take 10 mEq by mouth daily.         Marland Kitchen SPIRONOLACTONE 25 MG PO TABS   Oral    Take 25 mg by mouth daily.         . WARFARIN SODIUM 5 MG PO TABS   Oral   Take 5-7.5 mg by mouth every evening. Mon & Fri-1 tab (5 mg total) ; All other days-1.5 tabs (7.5 mg total)           BP 169/50  Pulse 78  Temp 98.2 F (36.8 C) (Oral)  Resp 26  SpO2 89%  Physical Exam  Nursing note and vitals reviewed. Constitutional: She is oriented to person, place, and time. She appears well-developed and well-nourished.       Slightly tachypneic, but comfortable   HENT:  Head: Normocephalic.  Mouth/Throat: Oropharynx is clear and moist.  Eyes: Conjunctivae normal are normal. Pupils are equal, round, and reactive to light.  Neck: Normal range of motion. Neck supple.  Cardiovascular: Normal rate, regular rhythm and normal heart sounds.        + JVD   Pulmonary/Chest: Effort normal.       + bilateral crackles   Abdominal: Soft. Bowel sounds are normal. She exhibits no distension. There is no tenderness. There is no rebound.  Musculoskeletal: Normal range of motion.       2+ edema   Neurological: She is alert and oriented to person, place, and time. No cranial nerve deficit.  Skin: Skin is warm and dry.  Psychiatric: She has a normal mood and affect. Her behavior is normal. Judgment and thought content normal.    ED Course  Procedures (including critical care time)  Labs Reviewed  PRO B NATRIURETIC PEPTIDE - Abnormal; Notable for the following:    Pro B Natriuretic peptide (BNP) 4620.0 (*)     All other components within normal limits  CBC WITH DIFFERENTIAL - Abnormal; Notable for the following:    RBC 3.38 (*)     Hemoglobin 9.6 (*)     HCT 29.7 (*)     All other components within normal limits  COMPREHENSIVE METABOLIC PANEL - Abnormal; Notable for the following:    Glucose, Bld 278 (*)     BUN 45 (*)     Creatinine, Ser 1.92 (*)     Albumin 3.1 (*)     GFR calc non Af Amer 23 (*)     GFR calc Af Amer 26 (*)     All other components within normal limits  POCT  I-STAT TROPONIN I  DIGOXIN LEVEL  PROTIME-INR   Dg Chest 2 View  06/29/2012  *RADIOLOGY REPORT*  Clinical Data: Shortness of breath.  CHEST - 2 VIEW  Comparison: Chest x-ray 03/24/2010.  Findings: Compared to the prior examination and there is a new multifocal bilateral air space disease which is predominately perihilar (left greater than right) in location.  There is some associated cephalization of the pulmonary vasculature, indistinctness of the interstitial markings and Kerley B lines, suggestive that these findings may be related to pulmonary edema. Trace bilateral pleural effusions.  Heart size is mildly enlarged. The patient is rotated to the left on today's exam, resulting in distortion of the mediastinal contours and reduced diagnostic sensitivity and specificity for mediastinal pathology. Atherosclerosis of the thoracic aorta.  IMPRESSION: 1.  Findings, as above, concerning for congestive heart failure. 2.  Given the relatively extensive airspace disease that is slightly asymmetric involving the left upper lobe to a greater extent than the remainder of the lungs, the possibility of superimposed acute infection is not excluded.  Clinical correlation is recommended. 3.  Atherosclerosis.   Original Report Authenticated By: Trudie Reed, M.D.      1. CHF exacerbation      Date: 06/29/2012  Rate: 89  Rhythm: atrial fibrillation  QRS Axis: normal  Intervals: normal  ST/T Wave abnormalities: TWI V4- V6 unchanged   Conduction Disutrbances:none  Narrative Interpretation:   Old EKG Reviewed: unchanged    MDM  [name] is a 78 y.o. female hx of HTN, afib on coumadin, here with worsening CHF. Will give IV lasix and call her cardiologist for admission.   5:30PM  I called Dr. Nadara Eaton, who will see the patient. He requested that hospitalist admit the patient. I discussed with hospitalist, who accepted the patient.        Richardean Canal, MD 06/29/12 [phone]

## 2012-06-29 NOTE — Consult Note (Signed)
CARDIOLOGY CONSULT NOTE  Patient ID: [name] MRN: [id number] DOB/AGE: [birth year] 78 y.o.  Admit date: 06/29/2012 Referring Physician  ED: Chaney Malling, MD Primary Physician:  Aida Puffer, MD Reason for Consultation  SOB and CHF  HPI: Patient is a 78 year old Caucasian female with chronic and permanent atrial fibrillation, chronic leg edema and obesity who was doing well until about 2 weeks ago gradually started noticing mild worsening leg edema, weight gain and shortness of breath. However shows overall doing fairly well until 3-4 days ago she started noticing moderate increase in shortness of breath and dyspnea on exertion, not feeling well, also had mild low-grade temperature over the weekend. Yesterday she was trying to get up by, fell to the ground and her daughter and son-in-law have her back. 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 She presented to emergency room have been asked to see her.  She denies any cough, she states that she slept flat in her bed using her CPAP last night. No hemoptysis, no painful swelling in the lower extremity, no recent long travel. No chest pain or palpitations.   Past Medical History  Diagnosis Date  . Hypertension   . Diabetes mellitus   . Glaucoma(365)   . Atrial fibrillation   . Hypothyroidism   . Endometrial cancer   . TIA (transient ischemic attack)   . Sleep apnea     CPAP at nite     Past Surgical History  Procedure Date  . Abdominal hysterectomy   . Ankle surgery      No family history on file.   Social History: History   Social History  . Marital Status: Divorced    Spouse Name: N/A    Number of Children: N/A  . Years of Education: N/A   Occupational History  . Not on file.   Social History Main Topics  . Smoking status: Never Smoker     . Smokeless tobacco: Never Used  . Alcohol Use: No  . Drug Use: No  . Sexually Active:    Other Topics Concern  . Not on file   Social History Narrative  . No narrative on file      Scheduled Meds:   . [COMPLETED] furosemide  40 mg Intravenous Once  . [COMPLETED] furosemide  40 mg Intravenous Once    ROS: General:  Mild low grade temperature over the weekend. NO chills/night sweats Eyes: no blurry vision, diplopia, or amaurosis ENT: no sore throat or hearing loss CV:No chest pain or palpitations GI: no abdominal pain, nausea, vomiting, diarrhea, or constipation GU: no dysuria, frequency, or hematuria Skin: no rash Neuro: C/O generalised weakness Musculoskeletal: no joint pain or swelling Heme: no bleeding, DVT, or easy bruising, tolerating Coumadin without any bleeding diathesis.  Endo: no polydipsia or polyuria    Physical Exam: Blood pressure 149/98, pulse 84, temperature 98.2 F (36.8 C), temperature source Oral, resp. rate 20, SpO2 92.00%.   General appearance: alert, cooperative, appears stated age, no distress and moderately obese Lungs: There are scattered rhonchi heard bilaterally, however course e crepitations are heard in the left upper lung fields in the infraclavicular region. Chest wall: no tenderness Heart: S1 is variable, S2 is normal is a 2/6 systolic ejection murmur heard in the apex and also in  the right sternal border Abdomen: soft, non-tender; bowel sounds normal; no masses,  no organomegaly and Obese with Pannus Extremities: edema 2-3 plus and no ulcers, gangrene or trophic changes Pulses: 2+ and symmetric Neurologic: Alert and oriented X 3, normal strength and tone. Normal symmetric reflexes. Normal coordination and gait  Labs:   Lab Results  Component Value Date   WBC 8.7 06/29/2012   HGB 9.6* 06/29/2012   HCT 29.7* 06/29/2012   MCV 87.9 06/29/2012   PLT 267 06/29/2012    Lab 06/29/12 1610  NA 135  K 4.5  CL 99  CO2 21  BUN 45*   CREATININE 1.92*  CALCIUM 9.3  PROT 7.8  BILITOT 0.4  ALKPHOS 45  ALT 17  AST 19  GLUCOSE 278*   Lab Results  Component Value Date   CKTOTAL 484* 10/19/2007   CKMB 1.6 10/19/2007   TROPONINI <0.30 11/09/2011    Lipid Panel  No results found for this basename: chol, trig, hdl, cholhdl, vldl, ldlcalc    EKG: Atrial fibrillation with a ventricular rate of 90 beats a minute, left hypertrophy, inferior wall MI, old lateral ST segment depression cannot rule out ischemia versus dig effect.frequent PVC. compared to 11/09/2011, lateral ST segment depression appears to be slightly more prominent.     Radiology: Dg Chest 2 View  06/29/2012  *RADIOLOGY REPORT*  Clinical Data: Shortness of breath.  CHEST - 2 VIEW  Comparison: Chest x-ray 03/24/2010.  Findings: Compared to the prior examination and there is a new multifocal bilateral air space disease which is predominately perihilar (left greater than right) in location.  There is some associated cephalization of the pulmonary vasculature, indistinctness of the interstitial markings and Kerley B lines, suggestive that these findings may be related to pulmonary edema. Trace bilateral pleural effusions.  Heart size is mildly enlarged. The patient is rotated to the left on today's exam, resulting in distortion of the mediastinal contours and reduced diagnostic sensitivity and specificity for mediastinal pathology. Atherosclerosis of the thoracic aorta.  IMPRESSION: 1.  Findings, as above, concerning for congestive heart failure. 2.  Given the relatively extensive airspace disease that is slightly asymmetric involving the left upper lobe to a greater extent than the remainder of the lungs, the possibility of superimposed acute infection is not excluded.  Clinical correlation is recommended. 3.  Atherosclerosis.   Original Report Authenticated By: Trudie Reed, M.D.       ASSESSMENT AND PLAN:  1.  acute on chronic diastolic heart failure. Superimposed  atypical pneumonia cannot be excluded. She has very coarse crepitus that is heard in the left upper lung fields in the infraclavicular region. Chest x-ray also is very suggestive of superimposed infiltrate in the left upper lung. Office Echo 04/27/11: Normal LVEF, Moderate LVH, Mod left atrial enlargement. Mild MR/TR. No pul hypertension. 2. Permanent atrial fibrillation.  3. Diabetes mellitus type 2 uncontrolled 4. Chronic renal failure 5. Hypertriglyceridemia. 6. Chronic fluid overload state and chronic leg edema. 7. Sleep apnea on CPAP and compliant.   8. Anemia which appears to be new. CBC that was done in August of 2012 by her primary care physician Dr. Aida Puffer, had revealed normal CBC. Hemoglobin was 13.3 with hematocrit of 42.5 at that time.  Recommendation: After she received IV Lasix, she is only started to feel better. But I'm concerned that she may have superimposed pneumonia and also cannot exclude presence of GI bleed or low-grade bleeding as she is also on chronic Coumadin therapy. Her baseline  creatinine is stable at 1.6-1.8. Continue diuresis for now her heart rate is well controlled and I suspect combination of pneumonia and anemia may be contributing to her acute presentation with acute diastolic heart failure. Continue to follow the patient with the hospitalist. The patient on IV Lasix 40 mg every 8 hours for now and potentially consider Foley catheter placement so she can rest. She'll also need a CPAP.  Pamella Pert, MD 06/29/2012, 7:44 PM [facility] Pager: [phone] Office: [phone] If no answer Cell [phone]

## 2012-06-29 NOTE — ED Notes (Signed)
Pt in room now; pt undressed, in gown, on monitor, continuous pulse oximetry and blood pressure cuff; EKG already performed in triage and family at bedside

## 2012-06-29 NOTE — Progress Notes (Signed)
ANTICOAGULATION CONSULT NOTE - Initial Consult  Pharmacy Consult:  Coumadin Indication:  Atrial fibrillation  No Known Allergies  Patient Measurements: Height: 5\' 6"  (167.6 cm) Weight: 239 lb 10.2 oz (108.7 kg) (scale B) IBW/kg (Calculated) : 59.3   Vital Signs: Temp: 98.3 F (36.8 C) (11/13 2024) Temp src: Oral (11/13 2024) BP: 161/44 mmHg (11/13 2024) Pulse Rate: 62  (11/13 2024)  Labs:  Basename 06/29/12 1743 06/29/12 1610  HGB -- 9.6*  HCT -- 29.7*  PLT -- 267  APTT -- --  LABPROT 14.0 --  INR 1.09 --  HEPARINUNFRC -- --  CREATININE -- 1.92*  CKTOTAL -- --  CKMB -- --  TROPONINI -- --    Estimated Creatinine Clearance: 27.2 ml/min (by C-G formula based on Cr of 1.92).   Medical History: Past Medical History  Diagnosis Date  . Hypertension   . Diabetes mellitus   . Glaucoma(365)   . Atrial fibrillation   . Hypothyroidism   . Endometrial cancer   . TIA (transient ischemic attack)   . Sleep apnea     CPAP at nite        Assessment: 57 YOF with history of atrial fibrillation to continue on Coumadin therapy.  INR currently sub-therapeutic because patient stopped Coumadin on 06/22/12 for a tooth extraction planned for 06/27/12.  Dental procedure was not done and patient resumed Coumadin on 06/28/12.  Per documentation, patient's anemia appears new and GIB or low-grade bleeding cannot be excluded.  Spoke to Dr. Arbutus Leas, will plan to continue on Coumadin and monitor for bleeding.  Patient reports no bleeding.   Goal of Therapy:  INR 2-3 Monitor platelets by anticoagulation protocol: Yes    Plan:  - Coumadin 10mg  PO today - Continue heparin SQ until INR therapeutic - Daily PT / INR - CBC in AM, check hemoccult per discussion with Dr. Arbutus Leas - Monitor for s/sx of bleeding     Laverne Klugh D. Laney Potash, PharmD, BCPS Pager:  [phone] 06/29/2012, 8:57 PM

## 2012-06-30 ENCOUNTER — Inpatient Hospital Stay (HOSPITAL_COMMUNITY): Payer: Medicare Other

## 2012-06-30 DIAGNOSIS — E119 Type 2 diabetes mellitus without complications: Secondary | ICD-10-CM

## 2012-06-30 DIAGNOSIS — I4891 Unspecified atrial fibrillation: Secondary | ICD-10-CM | POA: Diagnosis present

## 2012-06-30 DIAGNOSIS — I509 Heart failure, unspecified: Secondary | ICD-10-CM

## 2012-06-30 DIAGNOSIS — I5033 Acute on chronic diastolic (congestive) heart failure: Secondary | ICD-10-CM | POA: Diagnosis present

## 2012-06-30 LAB — TSH: TSH: 3.037 u[IU]/mL (ref 0.350–4.500)

## 2012-06-30 LAB — LIPID PANEL
HDL: 25 mg/dL — ABNORMAL LOW (ref >39)
LDL Cholesterol: 112 mg/dL — ABNORMAL HIGH (ref 0–99)
Total CHOL/HDL Ratio: 6.8 RATIO
Triglycerides: 163 mg/dL — ABNORMAL HIGH (ref <150)
VLDL: 33 mg/dL (ref 0–40)

## 2012-06-30 LAB — GLUCOSE, CAPILLARY: Glucose-Capillary: 371 mg/dL — ABNORMAL HIGH (ref 70–99)

## 2012-06-30 LAB — BASIC METABOLIC PANEL
Chloride: 99 mEq/L (ref 96–112)
GFR calc Af Amer: 27 mL/min — ABNORMAL LOW (ref >90)
Potassium: 4.1 mEq/L (ref 3.5–5.1)

## 2012-06-30 LAB — CBC
HCT: 27.4 % — ABNORMAL LOW (ref 36.0–46.0)
MCH: 29.4 pg (ref 26.0–34.0)
MCV: 88.7 fL (ref 78.0–100.0)
RBC: 3.09 MIL/uL — ABNORMAL LOW (ref 3.87–5.11)
WBC: 7.6 10*3/uL (ref 4.0–10.5)

## 2012-06-30 LAB — HEMOGLOBIN A1C
Hgb A1c MFr Bld: 8.3 % — ABNORMAL HIGH (ref <5.7)
Mean Plasma Glucose: 192 mg/dL — ABNORMAL HIGH (ref <117)

## 2012-06-30 MED ORDER — SPIRONOLACTONE 25 MG PO TABS
25.0000 mg | ORAL_TABLET | Freq: Every day | ORAL | Status: DC
  Administered 2012-07-01 – 2012-07-03 (×3): 25 mg via ORAL
  Filled 2012-06-30 (×3): qty 1

## 2012-06-30 MED ORDER — LIVING WELL WITH DIABETES BOOK
Freq: Once | Status: AC
  Administered 2012-06-30: 10:00:00
  Filled 2012-06-30: qty 1

## 2012-06-30 MED ORDER — INSULIN ASPART 100 UNIT/ML ~~LOC~~ SOLN
10.0000 [IU] | Freq: Three times a day (TID) | SUBCUTANEOUS | Status: DC
  Administered 2012-07-01 (×2): 10 [IU] via SUBCUTANEOUS

## 2012-06-30 MED ORDER — DEXTROSE 5 % IV SOLN
1.0000 g | INTRAVENOUS | Status: DC
  Administered 2012-06-30 – 2012-07-02 (×3): 1 g via INTRAVENOUS
  Filled 2012-06-30 (×4): qty 10

## 2012-06-30 MED ORDER — INSULIN ASPART 100 UNIT/ML ~~LOC~~ SOLN
0.0000 [IU] | Freq: Every day | SUBCUTANEOUS | Status: DC
  Administered 2012-06-30: 5 [IU] via SUBCUTANEOUS

## 2012-06-30 MED ORDER — SIMVASTATIN 20 MG PO TABS
20.0000 mg | ORAL_TABLET | Freq: Every day | ORAL | Status: DC
  Administered 2012-07-01 – 2012-07-02 (×2): 20 mg via ORAL
  Filled 2012-06-30 (×4): qty 1

## 2012-06-30 MED ORDER — INSULIN GLARGINE 100 UNIT/ML ~~LOC~~ SOLN
50.0000 [IU] | Freq: Every day | SUBCUTANEOUS | Status: DC
  Administered 2012-06-30: 50 [IU] via SUBCUTANEOUS

## 2012-06-30 MED ORDER — FUROSEMIDE 10 MG/ML IJ SOLN
80.0000 mg | Freq: Three times a day (TID) | INTRAMUSCULAR | Status: DC
  Administered 2012-07-01 – 2012-07-03 (×7): 80 mg via INTRAVENOUS
  Filled 2012-06-30 (×9): qty 8

## 2012-06-30 MED ORDER — INSULIN ASPART 100 UNIT/ML ~~LOC~~ SOLN
0.0000 [IU] | Freq: Three times a day (TID) | SUBCUTANEOUS | Status: DC
  Administered 2012-06-30 – 2012-07-01 (×3): 5 [IU] via SUBCUTANEOUS

## 2012-06-30 MED ORDER — WARFARIN SODIUM 7.5 MG PO TABS
7.5000 mg | ORAL_TABLET | Freq: Once | ORAL | Status: AC
  Administered 2012-06-30: 7.5 mg via ORAL
  Filled 2012-06-30: qty 1

## 2012-06-30 MED ORDER — AZITHROMYCIN 500 MG PO TABS
500.0000 mg | ORAL_TABLET | Freq: Every day | ORAL | Status: AC
  Administered 2012-06-30: 500 mg via ORAL
  Filled 2012-06-30: qty 1

## 2012-06-30 MED ORDER — AZITHROMYCIN 250 MG PO TABS
250.0000 mg | ORAL_TABLET | Freq: Every day | ORAL | Status: DC
  Administered 2012-07-01 – 2012-07-02 (×2): 250 mg via ORAL
  Filled 2012-06-30 (×3): qty 1

## 2012-06-30 NOTE — Plan of Care (Signed)
Problem: Undesirable Food Choices (NB-1.7) Goal: Nutrition education Formal process to instruct or train a patient/client in a skill or to impart knowledge to help patients/clients voluntarily manage or modify food choices and eating behavior to maintain or improve health.  Outcome: Completed/Met Date Met:  06/30/12  RD consulted for nutrition education regarding diabetes.   Recommend diet change to Carb Mod Medium wi NA and fluid restriction to better reinforce diet teaching.   NO recent A1C on file.   RD provided "Carbohydrate Counting for People with Diabetes" handout from the Academy of [employer]etics. Discussed different food groups and their effects on blood sugar, emphasizing carbohydrate-containing foods. Provided list of carbohydrates and recommended serving sizes of common foods.  Discussed importance of controlled and consistent carbohydrate intake throughout the day. Provided examples of ways to balance meals/snacks and encouraged intake of high-fiber, whole grain complex carbohydrates.  Expect fair compliance.  Body mass index is 38.61 kg/(m^2). Pt meets criteria for obesity class 2  based on current BMI.  Current diet order is Heart Healthy, patient is consuming approximately 100% of meals at this time. Labs and medications reviewed. No further nutrition interventions warranted at this time. RD contact information provided. If additional nutrition issues arise, please re-consult RD.  Clarene Duke RD, LDN Pager [phone] After Hours pager [phone]

## 2012-06-30 NOTE — Progress Notes (Signed)
Subjective:  Patient still complains of shortness of breath and being fatigued.  No fever, no cough or productive sputum.  She states that her "rattling" has improved in her lungs.  No other specific complaints but feels very weak.  Objective:  Vital Signs in the last 24 hours: Temp:  [98.4 F (36.9 C)-98.8 F (37.1 C)] 98.8 F (37.1 C) (11/14 2023) Pulse Rate:  [71-95] 95  (11/14 2023) Resp:  [18-20] 20  (11/14 2023) BP: (155-187)/(48-75) 173/60 mmHg (11/14 2023) SpO2:  [92 %-94 %] 93 % (11/14 2023) Weight:  [108.5 kg (239 lb 3.2 oz)] 108.5 kg (239 lb 3.2 oz) (11/14 0556)  Intake/Output from previous day: 11/13 0701 - 11/14 0700 In: -  Out: 700 [Urine:700]  Physical Exam:   General appearance: alert, cooperative, appears stated age, no distress, moderately obese and pale Eyes: conjunctivae/corneas clear. PERRL, EOM's intact. Fundi benign. Neck: no adenopathy, no carotid bruit, no JVD, supple, symmetrical, trachea midline and thyroid not enlarged, symmetric, no tenderness/mass/nodules Neck: JVP - normal, carotids 2+= without bruits Resp: bilateral rhonchi, left infraclavicular area was crepitus, crackles heard on change from yesterday. Chest wall: no tenderness Cardio: S1, S2 normal and 2/6 systolic ejection murmur heard in the apex and right sternal border. GI: soft, non-tender; bowel sounds normal; no masses,  no organomegaly Extremities: edema 2 plus and Homans sign is negative, no sign of DVT    Lab Results:  Basename 06/30/12 0610 06/29/12 1610  WBC 7.6 8.7  HGB 9.1* 9.6*  PLT 237 267    Basename 06/30/12 0610 06/29/12 1610  NA 138 135  K 4.1 4.5  CL 99 99  CO2 26 21  GLUCOSE 246* 278*  BUN 43* 45*  CREATININE 1.87* 1.92*   No results found for this basename: TROPONINI:2,CK,MB:2 in the last 72 hours Hepatic Function Panel  Basename 06/29/12 1610  PROT 7.8  ALBUMIN 3.1*  AST 19  ALT 17  ALKPHOS 45  BILITOT 0.4  BILIDIR --  IBILI --    Basename  06/30/12 0610  CHOL 170   No results found for this basename: PROTIME in the last 72 hours Lipid Panel     Component Value Date/Time   CHOL 170 06/30/2012 0610   TRIG 163* 06/30/2012 0610   HDL 25* 06/30/2012 0610   CHOLHDL 6.8 06/30/2012 0610   VLDL 33 06/30/2012 0610   LDLCALC 112* 06/30/2012 0610     Imaging: Imaging results have been reviewed  Cardiac Studies:  EKG: Atrial fibrillation with controlled ventricular response.  Left ventricle hypertrophy.  Repolarization abnormality cannot rule out lateral wall ischemia.  Normal QT interval.   Assessment/Plan:  1.  Acute on chronic diastolic heart failure.  Symptoms unchanged from yesterday.  Still patient feels short-winded but much better. Echocardiogram done today reveals hyperdynamic left ventricle with evidence of elevated left atrial pressure.  Mild aortic stenosis. 2.  Abnormal chest x-ray, patient has been sitting up on a chair and in spite of that continued to have significant hyalinization and physical examination shows find crepitus in the left infraclavicular area and findings are inconsistent with pure congestive heart failure.  I suspect atypical pneumonia. 3.  Anemia which is new but no significant change since yesterday's admission.  Guaiac stools not done and pending. 4.  Chronic renal failure, stable.  I will restart Aldactone as she had done well previously on this. 5.  Subtherapeutic INR, presently on heparin therapy.   Recommendation: I have changed her Lasix to IV and added  Rocephin along with Ancef for treatment of community-acquired pneumonia/atypical pneumonia.  I will continue to follow the patient with you. D/W Dr. Marcellus Scott.    Pamella Pert, M.D. 06/30/2012, 11:23 PM [facility] Pager: [phone] Office: [phone] If no answer: [phone]

## 2012-06-30 NOTE — Progress Notes (Signed)
UR Chart Review Completed  

## 2012-06-30 NOTE — Progress Notes (Signed)
11/14  Staff notes stated that patient wanted information on what to eat at home.  Has had diabetes for 18 years. Have patient watch DM videos #[id number], give [employer] notes on diabetes. Consult for dietician ordered.  Living Well with Diabetes booklet ordered from pharmacy.  Check patient on doing own insulin administration and doing own CBGs.  Will continue to follow while in hospital.  Smith Mince RN BSN CDE

## 2012-06-30 NOTE — Progress Notes (Signed)
2D Echo performed  06/30/2012

## 2012-06-30 NOTE — Progress Notes (Signed)
Placed pt on nasal cpap for rest per home settings of 13cm h2o and 2l o2 bleedin.  RN aware.  Pt is tolerating well at this time.  Sterile water added to max fill line of humidity chamber.

## 2012-06-30 NOTE — Progress Notes (Signed)
[facility] PROGRESS NOTE  [name]:[id number] DOB: 06-09-1926 DOA: 06/29/2012 PCP: Aida Puffer, MD  HPI 78 year old female patient with history of diabetes, diastolic CHF, chronic atrial fibrillation, chronic leg edema, obesity admitted on 11/13 with three-week history of shortness of breath and peripheral edema that has worsened in the past 2 days. The patient had some subjective fevers, but did not take her temperature. There were no chills or rigors. The patient denies any coughing, hemoptysis, dysuria, hematuria, abdominal pain, dizziness chest pain. She has not had any orthopnea or PND. The patient wears CPAP at night. She does not know her settings.  Of note, the patient states that her primary care provider decreased her Bumex 2 mg twice a day to Lasix 40 mg twice a day approximately 3 weeks ago. He was during this period of time, the patient states that she has had increasing shortness of breath and peripheral edema. The patient states that she has been compliant with her medications. She does not weigh herself daily. Denies weight loss, hematochezia, melena. She states that her appetite has been poor in the past few weeks.  Assessment/Plan: 1. Acute on chronic diastolic congestive heart failure: Clinically better. Office Echo 04/27/11 showed normal EF, moderate LVH. Cardiology consultation by Dr. Jacinto Halim appreciated. Continue diuresis with IV Lasix. Decompensation may have been secondary to worsening anemia and reduction in dose of diuretics. Cardiologist was concerned regarding superimposed pneumonia: Patient however has clinically improved without antibiotics. no history of cough, fever or leukocytosis. Will repeat chest x-ray to look for possible improvement. Hold off on antibiotics at this time. 2. Permanent atrial fibrillation: Controlled ventricular rate. INR is subtherapeutic. Coumadin per pharmacy. Continue digoxin and metoprolol. Digoxin level was 0.8. 3. Uncontrolled  DM 2: Adjust insulins close to home dose and monitor. Hemoglobin A1c 8.3 indicates poor outpatient control. 4. CKD. stage III: Creatinine at baseline. Monitor BMP closely while on diuretics. 5. Normocytic anemia: Hemoglobin in March was 11.4. Check stool for occult blood. Follow CBC in a.m. No overt bleeding. 6. OSA: Nightly CPAP. 7. Hypertension: Uncontrolled/fluctuating. Continue amlodipine, metoprolol  Code Status: Full Family Communication: Discussed directly with patient Disposition Plan: Home when medically stable   Consultants:  Cardiology  Procedures:  None  Antibiotics:  None  HPI/Subjective: Patient feels significantly better. Breathing almost at baseline. Denies cough.  Objective: Filed Vitals:   06/30/12 0138 06/30/12 0556 06/30/12 0932 06/30/12 1400  BP: 187/75 155/60 155/51 180/48  Pulse: 71 74 74 87  Temp: 98.5 F (36.9 C) 98.4 F (36.9 C)  98.8 F (37.1 C)  TempSrc:  Oral  Oral  Resp:  18  20  Height:      Weight:  108.5 kg (239 lb 3.2 oz)    SpO2:  94%  92%    Intake/Output Summary (Last 24 hours) at 06/30/12 1612 Last data filed at 06/30/12 1322  Gross per 24 hour  Intake    240 ml  Output   1700 ml  Net  -1460 ml   Filed Weights   06/29/12 2024 06/30/12 0556  Weight: 108.7 kg (239 lb 10.2 oz) 108.5 kg (239 lb 3.2 oz)    Exam:   General exam: Sitting comfortably on the chair without any distress.  Respiratory system: Reduced breath sounds in the bases with occasional basal crackles. Rest of lung fields are clear to auscultation. No increased work of breathing.  Cardiovascular system: S1 and S2 heard, irregular. Telemetry shows A. fib with ventricular rate in the 70s to 80s.  No JVD, murmurs or gallops. 2+ pitting bilateral lower extremity edema  Gastrointestinal system: Abdomen is nondistended, soft and nontender. Normal bowel sounds heard.  Central nervous system: Alert and oriented. No focal neurological deficits.  Extremities:  Symmetric 5 x 5 power.  Data Reviewed: Basic Metabolic Panel:  Lab 06/30/12 1610 06/29/12 1610  NA 138 135  K 4.1 4.5  CL 99 99  CO2 26 21  GLUCOSE 246* 278*  BUN 43* 45*  CREATININE 1.87* 1.92*  CALCIUM 9.1 9.3  MG -- --  PHOS -- --   Liver Function Tests:  Lab 06/29/12 1610  AST 19  ALT 17  ALKPHOS 45  BILITOT 0.4  PROT 7.8  ALBUMIN 3.1*   No results found for this basename: LIPASE:5,AMYLASE:5 in the last 168 hours No results found for this basename: AMMONIA:5 in the last 168 hours CBC:  Lab 06/30/12 0610 06/29/12 1610  WBC 7.6 8.7  NEUTROABS -- 6.3  HGB 9.1* 9.6*  HCT 27.4* 29.7*  MCV 88.7 87.9  PLT 237 267   Cardiac Enzymes: No results found for this basename: CKTOTAL:5,CKMB:5,CKMBINDEX:5,TROPONINI:5 in the last 168 hours BNP (last 3 results)  Basename 06/29/12 1610  PROBNP 4620.0*   CBG:  Lab 06/30/12 1122 06/30/12 0552 06/29/12 2220  GLUCAP 245* 250* 277*    No results found for this or any previous visit (from the past 240 hour(s)).   Studies: Dg Chest 2 View  06/29/2012  *RADIOLOGY REPORT*  Clinical Data: Shortness of breath.  CHEST - 2 VIEW  Comparison: Chest x-ray 03/24/2010.  Findings: Compared to the prior examination and there is a new multifocal bilateral air space disease which is predominately perihilar (left greater than right) in location.  There is some associated cephalization of the pulmonary vasculature, indistinctness of the interstitial markings and Kerley B lines, suggestive that these findings may be related to pulmonary edema. Trace bilateral pleural effusions.  Heart size is mildly enlarged. The patient is rotated to the left on today's exam, resulting in distortion of the mediastinal contours and reduced diagnostic sensitivity and specificity for mediastinal pathology. Atherosclerosis of the thoracic aorta.  IMPRESSION: 1.  Findings, as above, concerning for congestive heart failure. 2.  Given the relatively extensive airspace  disease that is slightly asymmetric involving the left upper lobe to a greater extent than the remainder of the lungs, the possibility of superimposed acute infection is not excluded.  Clinical correlation is recommended. 3.  Atherosclerosis.   Original Report Authenticated By: Trudie Reed, M.D.     Scheduled Meds:    . amLODipine  10 mg Oral Daily  . aspirin EC  81 mg Oral Daily  . bimatoprost  1 drop Both Eyes QHS  . brimonidine  1 drop Both Eyes BID  . calcium-vitamin D  1 tablet Oral Daily  . cyanocobalamin  500 mcg Oral Daily  . cycloSPORINE  1 drop Both Eyes Q12H  . digoxin  125 mcg Oral Daily  . [COMPLETED] furosemide  40 mg Intravenous Once  . [COMPLETED] furosemide  40 mg Intravenous Once  . furosemide  80 mg Intravenous Q12H  . heparin  5,000 Units Subcutaneous Q8H  . [COMPLETED] influenza  inactive virus vaccine  0.5 mL Intramuscular Tomorrow-1000  . insulin aspart  0-20 Units Subcutaneous TID WC  . insulin aspart  0-5 Units Subcutaneous QHS  . insulin glargine  25 Units Subcutaneous QHS  . levothyroxine  50 mcg Oral QAC breakfast  . [COMPLETED] living well with diabetes book  Does not apply Once  . metoprolol succinate  50 mg Oral Daily  . omega-3 acid ethyl esters  2 g Oral BID  . polyvinyl alcohol  1 drop Both Eyes QID  . potassium chloride  10 mEq Oral Daily  . sodium chloride  3 mL Intravenous Q12H  . [COMPLETED] warfarin  10 mg Oral Once  . warfarin  7.5 mg Oral ONCE-1800  . Warfarin - Pharmacist Dosing Inpatient   Does not apply q1800  . [DISCONTINUED] aspirin  81 mg Oral Daily  . [DISCONTINUED] Biotin  500 mg Oral Daily  . [DISCONTINUED] brimonidine  1 drop Both Eyes Q12H  . [DISCONTINUED] Calcium Carbonate-Vitamin D  600 mg Oral Daily  . [DISCONTINUED] Polyethyl Glycol-Propyl Glycol  1 drop Ophthalmic QID  . [DISCONTINUED] warfarin  10 mg Oral ONCE-1800   Continuous Infusions:   Active Problems:  HTN (hypertension)  Hypothyroidism  DM2 (diabetes  mellitus, type 2)    Time spent: 45 minutes    [facility]  [facility] Pager [phone]. If 8PM-8AM, please contact night-coverage at [email or web address], password [facility] 06/30/2012, 4:12 PM  LOS: 1 day

## 2012-06-30 NOTE — Progress Notes (Signed)
ANTICOAGULATION CONSULT NOTE  Pharmacy Consult:  Coumadin Indication:  Atrial fibrillation  No Known Allergies  Patient Measurements: Height: 5\' 6"  (167.6 cm) Weight: 239 lb 3.2 oz (108.5 kg) (scale B) IBW/kg (Calculated) : 59.3   Vital Signs: Temp: 98.4 F (36.9 C) (11/14 0556) Temp src: Oral (11/14 0556) BP: 155/51 mmHg (11/14 0932) Pulse Rate: 74  (11/14 0932)  Labs:  Basename 06/30/12 0610 06/29/12 1743 06/29/12 1610  HGB 9.1* -- 9.6*  HCT 27.4* -- 29.7*  PLT 237 -- 267  APTT -- -- --  LABPROT 15.9* 14.0 --  INR 1.30 1.09 --  HEPARINUNFRC -- -- --  CREATININE 1.87* -- 1.92*  CKTOTAL -- -- --  CKMB -- -- --  TROPONINI -- -- --    Estimated Creatinine Clearance: 27.9 ml/min (by C-G formula based on Cr of 1.87).   Medical History: Past Medical History  Diagnosis Date  . Hypertension   . Glaucoma(365)   . Atrial fibrillation   . Hypothyroidism   . Endometrial cancer   . TIA (transient ischemic attack) 10/2011    "that's questionable" (06/29/12)  . Hypercholesterolemia   . CHF (congestive heart failure)   . Pneumonia 1980's    "couple times" (06/29/12)  . Shortness of breath     "just moving around; yesterday & today" (06/29/12)  . OSA on CPAP   . H/O hiatal hernia     "years and years ago" (06/29/12)  . GERD (gastroesophageal reflux disease)     "several years ago" (06/29/12)  . Arthritis     "in my toes" (06/29/12)  . Type II diabetes mellitus 05/1994  . Fall at home 06/24/2012    "rolled out of bed getting alarm clark that had fallen on floor" (06/29/12)        Assessment: 88 YOF with history of atrial fibrillation to continue on Coumadin therapy.  INR currently sub-therapeutic because patient stopped Coumadin on 06/22/12 for a tooth extraction planned for 06/27/12.  Dental procedure was not done and patient resumed Coumadin on 06/28/12.  Per documentation, patient's anemia appears new and GIB or low-grade bleeding cannot be excluded.  Spoke to Dr.  Arbutus Leas, will plan to continue on Coumadin and monitor for bleeding.  No bleeding noted.   Goal of Therapy:  INR 2-3 Monitor platelets by anticoagulation protocol: Yes    Plan:  - Coumadin 7.5mg  PO today - Continue heparin SQ until INR therapeutic - Daily PT / INR - Monitor for s/sx of bleeding     Talbert Cage, PharmD Pager:  [phone] 06/30/2012, 10:49 AM

## 2012-07-01 DIAGNOSIS — J189 Pneumonia, unspecified organism: Secondary | ICD-10-CM

## 2012-07-01 LAB — BASIC METABOLIC PANEL
BUN: 37 mg/dL — ABNORMAL HIGH (ref 6–23)
CO2: 26 mEq/L (ref 19–32)
Chloride: 95 mEq/L — ABNORMAL LOW (ref 96–112)
GFR calc Af Amer: 30 mL/min — ABNORMAL LOW (ref >90)
Glucose, Bld: 290 mg/dL — ABNORMAL HIGH (ref 70–99)
Potassium: 3.7 mEq/L (ref 3.5–5.1)

## 2012-07-01 LAB — CBC
HCT: 28.3 % — ABNORMAL LOW (ref 36.0–46.0)
Hemoglobin: 9.2 g/dL — ABNORMAL LOW (ref 12.0–15.0)
MCV: 87.1 fL (ref 78.0–100.0)
Platelets: 247 10*3/uL (ref 150–400)
RBC: 3.25 MIL/uL — ABNORMAL LOW (ref 3.87–5.11)
WBC: 7.8 10*3/uL (ref 4.0–10.5)

## 2012-07-01 LAB — GLUCOSE, CAPILLARY: Glucose-Capillary: 255 mg/dL — ABNORMAL HIGH (ref 70–99)

## 2012-07-01 LAB — PRO B NATRIURETIC PEPTIDE: Pro B Natriuretic peptide (BNP): 3840 pg/mL — ABNORMAL HIGH (ref 0–450)

## 2012-07-01 MED ORDER — WARFARIN SODIUM 7.5 MG PO TABS
7.5000 mg | ORAL_TABLET | Freq: Once | ORAL | Status: AC
  Administered 2012-07-01: 7.5 mg via ORAL
  Filled 2012-07-01: qty 1

## 2012-07-01 MED ORDER — INSULIN ASPART 100 UNIT/ML ~~LOC~~ SOLN
0.0000 [IU] | Freq: Three times a day (TID) | SUBCUTANEOUS | Status: DC
Start: 2012-07-01 — End: 2012-07-03
  Administered 2012-07-01: 8 [IU] via SUBCUTANEOUS
  Administered 2012-07-02: 5 [IU] via SUBCUTANEOUS
  Administered 2012-07-02: 3 [IU] via SUBCUTANEOUS
  Administered 2012-07-02: 5 [IU] via SUBCUTANEOUS
  Administered 2012-07-03: 3 [IU] via SUBCUTANEOUS
  Administered 2012-07-03: 5 [IU] via SUBCUTANEOUS

## 2012-07-01 MED ORDER — INSULIN ASPART 100 UNIT/ML ~~LOC~~ SOLN
12.0000 [IU] | Freq: Three times a day (TID) | SUBCUTANEOUS | Status: DC
  Administered 2012-07-02 – 2012-07-03 (×3): 12 [IU] via SUBCUTANEOUS

## 2012-07-01 MED ORDER — ONDANSETRON HCL 4 MG/2ML IJ SOLN: 4.0000 mg | Freq: Four times a day (QID) | INTRAMUSCULAR | Status: DC | PRN

## 2012-07-01 MED ORDER — INSULIN GLARGINE 100 UNIT/ML ~~LOC~~ SOLN
55.0000 [IU] | Freq: Every day | SUBCUTANEOUS | Status: DC
  Administered 2012-07-01 – 2012-07-02 (×2): 55 [IU] via SUBCUTANEOUS

## 2012-07-01 MED ORDER — INSULIN ASPART 100 UNIT/ML ~~LOC~~ SOLN
0.0000 [IU] | Freq: Every day | SUBCUTANEOUS | Status: DC
  Administered 2012-07-01: 3 [IU] via SUBCUTANEOUS

## 2012-07-01 MED ORDER — PROMETHAZINE HCL 25 MG/ML IJ SOLN
12.5000 mg | Freq: Four times a day (QID) | INTRAMUSCULAR | Status: DC | PRN
  Administered 2012-07-01: 12.5 mg via INTRAVENOUS
  Filled 2012-07-01: qty 1

## 2012-07-01 MED ORDER — INSULIN ASPART 100 UNIT/ML ~~LOC~~ SOLN: 0.0000 [IU] | Freq: Three times a day (TID) | SUBCUTANEOUS | Status: DC

## 2012-07-01 MED ORDER — GUAIFENESIN-DM 100-10 MG/5ML PO SYRP
5.0000 mL | ORAL_SOLUTION | ORAL | Status: DC | PRN
  Administered 2012-07-01: 5 mL via ORAL
  Filled 2012-07-01: qty 5

## 2012-07-01 NOTE — Progress Notes (Signed)
ANTICOAGULATION CONSULT NOTE  Pharmacy Consult:  Coumadin Indication:  Atrial fibrillation  No Known Allergies  Patient Measurements: Height: 5\' 6"  (167.6 cm) Weight: 237 lb 14.4 oz (107.911 kg) (scale b) IBW/kg (Calculated) : 59.3   Vital Signs: Temp: 98.3 F (36.8 C) (11/15 0446) Temp src: Rectal (11/15 0446) BP: 168/66 mmHg (11/15 0900) Pulse Rate: 90  (11/15 0900)  Labs:  Basename 07/01/12 0435 06/30/12 0610 06/29/12 1743 06/29/12 1610  HGB 9.2* 9.1* -- --  HCT 28.3* 27.4* -- 29.7*  PLT 247 237 -- 267  APTT -- -- -- --  LABPROT 15.9* 15.9* 14.0 --  INR 1.30 1.30 1.09 --  HEPARINUNFRC -- -- -- --  CREATININE 1.71* 1.87* -- 1.92*  CKTOTAL -- -- -- --  CKMB -- -- -- --  TROPONINI -- -- -- --    Estimated Creatinine Clearance: 30.4 ml/min (by C-G formula based on Cr of 1.71).   Medical History: Past Medical History  Diagnosis Date  . Hypertension   . Glaucoma(365)   . Atrial fibrillation   . Hypothyroidism   . Endometrial cancer   . TIA (transient ischemic attack) 10/2011    "that's questionable" (06/29/12)  . Hypercholesterolemia   . CHF (congestive heart failure)   . Pneumonia 1980's    "couple times" (06/29/12)  . Shortness of breath     "just moving around; yesterday & today" (06/29/12)  . OSA on CPAP   . H/O hiatal hernia     "years and years ago" (06/29/12)  . GERD (gastroesophageal reflux disease)     "several years ago" (06/29/12)  . Arthritis     "in my toes" (06/29/12)  . Type II diabetes mellitus 05/1994  . Fall at home 06/24/2012    "rolled out of bed getting alarm Lakeya Mulka that had fallen on floor" (06/29/12)        Assessment: INR 1.3 in this 48 YOF with history of atrial fibrillation who continues on Coumadin therapy.  INR currently sub-therapeutic because patient stopped Coumadin on 06/22/12 for a tooth extraction planned for 06/27/12.  Dental procedure was not done and patient resumed Coumadin on 06/28/12.  Per documentation, patient's  anemia appears new this admit, CBC stable, pltc 247K. Guaic stools not done and pending.   No bleeding noted. Coumadin dose PTA is 7.5mg  qTWRSS and 5mg  qMF.  Continues on SQ heparin for VTE prophylaxis.  Azithromycin started 06/20/12-which may increase coumadin's effect-will monitor.  Goal of Therapy:  INR 2-3 Monitor platelets by anticoagulation protocol: Yes    Plan:  - Coumadin 7.5mg  PO today - Continue heparin SQ until INR therapeutic - Daily PT / INR - Monitor for s/sx of bleeding   Noah Delaine, RPh Clinical Pharmacist Pager: [phone] 07/01/2012, 1:06 PM

## 2012-07-01 NOTE — Progress Notes (Signed)
Page MD. Pt had a episode of N/V. Gave Pt zofran after. Pt stated it came on suddenly. Will continue to monitor

## 2012-07-01 NOTE — Progress Notes (Signed)
Page Mid level for one time zofran dose. Pt complaining of nausea

## 2012-07-01 NOTE — Progress Notes (Signed)
[facility] PROGRESS NOTE  [name] ZOX:[id number] DOB: 26-Feb-1926 DOA: 06/29/2012 PCP: Aida Puffer, MD  HPI 78 year old female patient with history of diabetes, diastolic CHF, chronic atrial fibrillation, chronic leg edema, obesity admitted on 11/13 with three-week history of shortness of breath and peripheral edema that has worsened in the past 2 days. The patient had some subjective fevers, but did not take her temperature. There were no chills or rigors. The patient denies any coughing, hemoptysis, dysuria, hematuria, abdominal pain, dizziness chest pain. She has not had any orthopnea or PND. The patient wears CPAP at night. She does not know her settings.  Of note, the patient states that her primary care provider decreased her Bumex 2 mg twice a day to Lasix 40 mg twice a day approximately 3 weeks ago. He was during this period of time, the patient states that she has had increasing shortness of breath and peripheral edema. The patient states that she has been compliant with her medications. She does not weigh herself daily. Denies weight loss, hematochezia, melena. She states that her appetite has been poor in the past few weeks.  Assessment/Plan: 1. Acute on chronic diastolic congestive heart failure: Clinically improving. Office Echo 04/27/11 showed normal EF, moderate LVH. Cardiology following. Continue diuresis with IV Lasix. Decompensation may have been secondary to worsening anemia, possible pneumonia and reduction in dose of diuretics. Chest x-ray showed minimal improvement on 11/14 with ongoing concern for atypical pneumonia-started antibiotics on 11/14. Per records, no significant negative balance: Lasix dose increased. 2. Pneumonia: Continue IV Rocephin and azithromycin. 3. Permanent atrial fibrillation: Controlled ventricular rate. INR is subtherapeutic. Coumadin per pharmacy. Continue digoxin and metoprolol. Digoxin level was 0.8. 4. Uncontrolled DM 2: Further adjust  insulins. Hemoglobin A1c 8.3 indicates poor outpatient control. 5. CKD. stage III: Creatinine at baseline. Monitor BMP closely while on diuretics. 6. Normocytic anemia: Hemoglobin in March was 11.4. Check stool for occult blood. Hemoglobin stable. No overt bleeding. Will need outpatient workup. 7. OSA: Nightly CPAP. 8. Hypertension: Uncontrolled/fluctuating. Continue amlodipine, metoprolol  Code Status: Full Family Communication: Discussed directly with patient Disposition Plan: Home when medically stable   Consultants:  Cardiology  Procedures:  None  Antibiotics:  IV Rocephin and oral Z-Pak. 11/14  HPI/Subjective: Patient feels significantly better. Still with mild dyspnea. Mild cough-nonproductive  Objective: Filed Vitals:   07/01/12 0438 07/01/12 0446 07/01/12 0900 07/01/12 1425  BP: 194/57 186/69 168/66 155/47  Pulse:  85 90 83  Temp:  98.3 F (36.8 C)  98.5 F (36.9 C)  TempSrc:  Rectal  Oral  Resp:  20 20 20   Height:      Weight: 107.911 kg (237 lb 14.4 oz)     SpO2:  91% 94% 95%    Intake/Output Summary (Last 24 hours) at 07/01/12 1714 Last data filed at 07/01/12 1603  Gross per 24 hour  Intake    890 ml  Output    901 ml  Net    -11 ml   Filed Weights   06/29/12 2024 06/30/12 0556 07/01/12 0438  Weight: 108.7 kg (239 lb 10.2 oz) 108.5 kg (239 lb 3.2 oz) 107.911 kg (237 lb 14.4 oz)    Exam:   General exam: Sitting comfortably on the chair without any distress.  Respiratory system: Reduced breath sounds in the bases with occasional basal crackles-improving. Rest of lung fields are clear to auscultation. No increased work of breathing.  Cardiovascular system: S1 and S2 heard, irregular. Telemetry shows A. fib with ventricular rate in  the 70s to 80s. No JVD, murmurs or gallops. 2+ pitting bilateral lower extremity edema  Gastrointestinal system: Abdomen is nondistended, soft and nontender. Normal bowel sounds heard.  Central nervous system: Alert  and oriented. No focal neurological deficits.  Extremities: Symmetric 5 x 5 power.  Data Reviewed: Basic Metabolic Panel:  Lab 07/01/12 6045 06/30/12 0610 06/29/12 1610  NA 133* 138 135  K 3.7 4.1 4.5  CL 95* 99 99  CO2 26 26 21   GLUCOSE 290* 246* 278*  BUN 37* 43* 45*  CREATININE 1.71* 1.87* 1.92*  CALCIUM 9.4 9.1 9.3  MG -- -- --  PHOS -- -- --   Liver Function Tests:  Lab 06/29/12 1610  AST 19  ALT 17  ALKPHOS 45  BILITOT 0.4  PROT 7.8  ALBUMIN 3.1*   No results found for this basename: LIPASE:5,AMYLASE:5 in the last 168 hours No results found for this basename: AMMONIA:5 in the last 168 hours CBC:  Lab 07/01/12 0435 06/30/12 0610 06/29/12 1610  WBC 7.8 7.6 8.7  NEUTROABS -- -- 6.3  HGB 9.2* 9.1* 9.6*  HCT 28.3* 27.4* 29.7*  MCV 87.1 88.7 87.9  PLT 247 237 267   Cardiac Enzymes: No results found for this basename: CKTOTAL:5,CKMB:5,CKMBINDEX:5,TROPONINI:5 in the last 168 hours BNP (last 3 results)  Basename 07/01/12 0435 06/29/12 1610  PROBNP 3840.0* 4620.0*   CBG:  Lab 07/01/12 1607 07/01/12 1108 07/01/12 0636 06/30/12 2114 06/30/12 1619  GLUCAP 277* 265* 255* 371* 285*    No results found for this or any previous visit (from the past 240 hour(s)).   Studies: Dg Chest 2 View  06/30/2012  *RADIOLOGY REPORT*  Clinical Data: Follow up of congestive heart failure and possible superimposed edema.  CHEST - 2 VIEW  Comparison: Two-view chest 06/29/2012.  Findings: The heart is at the upper limits of normal for size. Patchy bilateral interstitial and airspace disease is present. There is slight improved aeration on the left.  No new consolidation is evident.  Small bilateral pleural effusions persist.  The visualized soft tissues and bony thorax are unremarkable.  IMPRESSION:  1.  Slight improved aeration on the left.  Edema is still favored. 2.  Superimposed infection cannot be excluded.  There is no significant worsening. 3.  Atherosclerosis.   Original Report  Authenticated By: Marin Roberts, M.D.     Scheduled Meds:    . aspirin EC  81 mg Oral Daily  . [COMPLETED] azithromycin  500 mg Oral Daily   Followed by  . azithromycin  250 mg Oral q1800  . bimatoprost  1 drop Both Eyes QHS  . brimonidine  1 drop Both Eyes BID  . calcium-vitamin D  1 tablet Oral Daily  . cefTRIAXone (ROCEPHIN)  IV  1 g Intravenous Q24H  . cyanocobalamin  500 mcg Oral Daily  . cycloSPORINE  1 drop Both Eyes Q12H  . digoxin  125 mcg Oral Daily  . furosemide  80 mg Intravenous Q8H  . heparin  5,000 Units Subcutaneous Q8H  . insulin aspart  0-5 Units Subcutaneous QHS  . insulin aspart  0-9 Units Subcutaneous TID WC  . insulin aspart  10 Units Subcutaneous TID WC  . insulin glargine  50 Units Subcutaneous QHS  . levothyroxine  50 mcg Oral QAC breakfast  . metoprolol succinate  50 mg Oral Daily  . omega-3 acid ethyl esters  2 g Oral BID  . polyvinyl alcohol  1 drop Both Eyes QID  . potassium chloride  10  mEq Oral Daily  . simvastatin  20 mg Oral q1800  . sodium chloride  3 mL Intravenous Q12H  . spironolactone  25 mg Oral Daily  . [COMPLETED] warfarin  7.5 mg Oral ONCE-1800  . warfarin  7.5 mg Oral ONCE-1800  . Warfarin - Pharmacist Dosing Inpatient   Does not apply q1800  . [DISCONTINUED] amLODipine  10 mg Oral Daily  . [DISCONTINUED] furosemide  80 mg Intravenous Q12H   Continuous Infusions:   Principal Problem:  *Diastolic CHF, acute on chronic Active Problems:  HTN (hypertension)  Hypothyroidism  DM2 (diabetes mellitus, type 2)  Atrial fibrillation  CKD (chronic kidney disease), stage III    Time spent: 30 minutes    [facility]  [facility] Pager [phone]. If 8PM-8AM, please contact night-coverage at [email or web address], password [facility] 07/01/2012, 5:14 PM  LOS: 2 days

## 2012-07-01 NOTE — Progress Notes (Signed)
Inpatient Diabetes Program Recommendations  AACE/ADA: New Consensus Statement on Inpatient Glycemic Control (2013)  Target Ranges:  Prepandial:   less than 140 mg/dL      Peak postprandial:   less than 180 mg/dL (1-2 hours)      Critically ill patients:  140 - 180 mg/dL   Results for [name], [name] (MRN [id number]) as of 07/01/2012 13:24  Ref. Range 06/30/2012 05:52 06/30/2012 11:22 06/30/2012 16:19 06/30/2012 21:14 07/01/2012 06:36 07/01/2012 11:08  Glucose-Capillary Latest Range: 70-99 mg/dL 409 (H) 811 (H) 914 (H) 371 (H) 255 (H) 265 (H)    Inpatient Diabetes Program Recommendations Correction (SSI): Please increase Novolog correction to resistant scale since CBGs have been consistently over 250 mg/dl over the past 24 hours.  Note: Patient started on increased dose of Lantus 50 units last night 06/30/12 but CBGs continue to be elevated.  At home patient takes Lantus 50 units daily, Novolog 10 units with breakfast, 15 units with lunch, and 25 units with supper.  Please consider increasing correction Novolog to resistant scale.  Will continue to follow.  Thanks, Orlando Penner, RN, BSN, CCRN Diabetes Coordinator Inpatient Diabetes Program [phone]

## 2012-07-01 NOTE — Progress Notes (Signed)
Placed pt on her nasal cpap from home.   2l o2 bledin.  RN notified.  Sterile water added to humidity chamber.   Pt is tolerating well at this time.  Request made via service response for [employer] to inspect patient's machine.  Cpap noted to be in good working order, no frays or defects noted with cord.

## 2012-07-02 DIAGNOSIS — R112 Nausea with vomiting, unspecified: Secondary | ICD-10-CM

## 2012-07-02 LAB — BASIC METABOLIC PANEL
CO2: 31 mEq/L (ref 19–32)
Chloride: 95 mEq/L — ABNORMAL LOW (ref 96–112)
Creatinine, Ser: 1.82 mg/dL — ABNORMAL HIGH (ref 0.50–1.10)
Glucose, Bld: 253 mg/dL — ABNORMAL HIGH (ref 70–99)

## 2012-07-02 LAB — PROTIME-INR: Prothrombin Time: 16.3 seconds — ABNORMAL HIGH (ref 11.6–15.2)

## 2012-07-02 LAB — GLUCOSE, CAPILLARY
Glucose-Capillary: 179 mg/dL — ABNORMAL HIGH (ref 70–99)
Glucose-Capillary: 241 mg/dL — ABNORMAL HIGH (ref 70–99)

## 2012-07-02 LAB — CBC
HCT: 28.8 % — ABNORMAL LOW (ref 36.0–46.0)
Platelets: 254 10*3/uL (ref 150–400)
RBC: 3.27 MIL/uL — ABNORMAL LOW (ref 3.87–5.11)
RDW: 14.7 % (ref 11.5–15.5)
WBC: 12.3 10*3/uL — ABNORMAL HIGH (ref 4.0–10.5)

## 2012-07-02 MED ORDER — WARFARIN SODIUM 10 MG PO TABS
10.0000 mg | ORAL_TABLET | Freq: Once | ORAL | Status: AC
  Administered 2012-07-02: 10 mg via ORAL
  Filled 2012-07-02: qty 1

## 2012-07-02 NOTE — Progress Notes (Signed)
[facility] PROGRESS NOTE  CHALLIS CRILL HQI:[id number] DOB: January 26, 1926 DOA: 06/29/2012 PCP: Aida Puffer, MD  HPI 78 year old female patient with history of diabetes, diastolic CHF, chronic atrial fibrillation, chronic leg edema, obesity admitted on 11/13 with three-week history of shortness of breath and peripheral edema that has worsened in the past 2 days. The patient had some subjective fevers, but did not take her temperature. There were no chills or rigors. The patient denies any coughing, hemoptysis, dysuria, hematuria, abdominal pain, dizziness chest pain. She has not had any orthopnea or PND. The patient wears CPAP at night. She does not know her settings.  Of note, the patient states that her primary care provider decreased her Bumex 2 mg twice a day to Lasix 40 mg twice a day approximately 3 weeks ago. He was during this period of time, the patient states that she has had increasing shortness of breath and peripheral edema. The patient states that she has been compliant with her medications. She does not weigh herself daily. Denies weight loss, hematochezia, melena. She states that her appetite has been poor in the past few weeks.  Assessment/Plan: 1. Nausea and vomiting: Resolved. Patient received Zofran followed by Phenergan last night. Phenergan made her extremely drowsy. Monitor with regular diet. DC Phenergan. 2. Acute on chronic diastolic congestive heart failure: Clinically improving. Office Echo 04/27/11 showed normal EF, moderate LVH. Cardiology following. Continue diuresis with IV Lasix for additional 24 hours and Aldactone. Decompensation may have been secondary to worsening anemia, possible pneumonia and reduction in dose of diuretics. Consider switching to bumetanide in a.m. Discussed with cardiologist. 3. Pneumonia: Continue IV Rocephin and azithromycin. Patient does not have a UTI. 4. Permanent atrial fibrillation: Controlled ventricular rate. INR is subtherapeutic.  Coumadin per pharmacy. Continue digoxin and metoprolol. Digoxin level was 0.8. 5. Uncontrolled DM 2: Hemoglobin A1c 8.3 indicates poor outpatient control. Much improved glycemic control. Continue insulins. 6. CKD. stage III: Creatinine at baseline. Monitor BMP closely while on diuretics. 7. Normocytic anemia: Hemoglobin in March was 11.4. Check stool for occult blood. Hemoglobin stable. No overt bleeding. Will need outpatient workup. 8. OSA: Nightly CPAP. 9. Hypertension: Reasonably controlled. Continue amlodipine, metoprolol  Code Status: Full Family Communication: Discussed directly with patient. Discussed with patient's daughter Ms. Jenell Milliner. Disposition Plan: Home when medically stable   Consultants:  Cardiology  Procedures:  None  Antibiotics:  IV Rocephin and oral Z-Pak. 11/14 >>  HPI/Subjective: Sleepy but waking up. No nausea or vomiting or abdominal pain. Breathing much improved. Some cough.  Objective: Filed Vitals:   07/02/12 0531 07/02/12 1104 07/02/12 1412 07/02/12 1415  BP: 154/44 157/57 153/47   Pulse: 53 76 51 56  Temp: 99.5 F (37.5 C) 98.5 F (36.9 C) 98.8 F (37.1 C)   TempSrc: Oral  Oral   Resp: 16 18 19 17   Height:      Weight: 104.4 kg (230 lb 2.6 oz)     SpO2: 90% 93% 91%     Intake/Output Summary (Last 24 hours) at 07/02/12 1551 Last data filed at 07/02/12 0850  Gross per 24 hour  Intake      2 ml  Output   1200 ml  Net  -1198 ml   Filed Weights   06/30/12 0556 07/01/12 0438 07/02/12 0531  Weight: 108.5 kg (239 lb 3.2 oz) 107.911 kg (237 lb 14.4 oz) 104.4 kg (230 lb 2.6 oz)    Exam:   General exam: Comfortable  Respiratory system: Reduced breath sounds in the bases  with occasional basal crackles-improving. Few coarse crackles left upper lung field anteriorly .Rest of lung fields are clear to auscultation. No increased work of breathing.  Cardiovascular system: S1 and S2 heard, irregular. Telemetry shows A. fib with ventricular  rate in the 70s to 90s. No JVD, murmurs or gallops. 1+ pitting bilateral lower extremity edema  Gastrointestinal system: Abdomen is nondistended, soft and nontender. Normal bowel sounds heard.  Central nervous system: Somnolent but easily arousable and oriented. No focal neurological deficits.  Extremities: Symmetric 5 x 5 power.  Data Reviewed: Basic Metabolic Panel:  Lab 07/02/12 1610 07/01/12 0435 06/30/12 0610 06/29/12 1610  NA 134* 133* 138 135  K 4.1 3.7 4.1 4.5  CL 95* 95* 99 99  CO2 31 26 26 21   GLUCOSE 253* 290* 246* 278*  BUN 35* 37* 43* 45*  CREATININE 1.82* 1.71* 1.87* 1.92*  CALCIUM 9.4 9.4 9.1 9.3  MG -- -- -- --  PHOS -- -- -- --   Liver Function Tests:  Lab 06/29/12 1610  AST 19  ALT 17  ALKPHOS 45  BILITOT 0.4  PROT 7.8  ALBUMIN 3.1*   No results found for this basename: LIPASE:5,AMYLASE:5 in the last 168 hours No results found for this basename: AMMONIA:5 in the last 168 hours CBC:  Lab 07/02/12 0550 07/01/12 0435 06/30/12 0610 06/29/12 1610  WBC 12.3* 7.8 7.6 8.7  NEUTROABS -- -- -- 6.3  HGB 9.2* 9.2* 9.1* 9.6*  HCT 28.8* 28.3* 27.4* 29.7*  MCV 88.1 87.1 88.7 87.9  PLT 254 247 237 267   Cardiac Enzymes: No results found for this basename: CKTOTAL:5,CKMB:5,CKMBINDEX:5,TROPONINI:5 in the last 168 hours BNP (last 3 results)  Basename 07/01/12 0435 06/29/12 1610  PROBNP 3840.0* 4620.0*   CBG:  Lab 07/02/12 1056 07/02/12 0652 07/01/12 2106 07/01/12 1607 07/01/12 1108  GLUCAP 179* 211* 281* 277* 265*    No results found for this or any previous visit (from the past 240 hour(s)).   Studies: Dg Chest 2 View  06/30/2012  *RADIOLOGY REPORT*  Clinical Data: Follow up of congestive heart failure and possible superimposed edema.  CHEST - 2 VIEW  Comparison: Two-view chest 06/29/2012.  Findings: The heart is at the upper limits of normal for size. Patchy bilateral interstitial and airspace disease is present. There is slight improved aeration on the  left.  No new consolidation is evident.  Small bilateral pleural effusions persist.  The visualized soft tissues and bony thorax are unremarkable.  IMPRESSION:  1.  Slight improved aeration on the left.  Edema is still favored. 2.  Superimposed infection cannot be excluded.  There is no significant worsening. 3.  Atherosclerosis.   Original Report Authenticated By: Marin Roberts, M.D.     Scheduled Meds:    . aspirin EC  81 mg Oral Daily  . azithromycin  250 mg Oral q1800  . bimatoprost  1 drop Both Eyes QHS  . brimonidine  1 drop Both Eyes BID  . calcium-vitamin D  1 tablet Oral Daily  . cefTRIAXone (ROCEPHIN)  IV  1 g Intravenous Q24H  . cyanocobalamin  500 mcg Oral Daily  . cycloSPORINE  1 drop Both Eyes Q12H  . digoxin  125 mcg Oral Daily  . furosemide  80 mg Intravenous Q8H  . heparin  5,000 Units Subcutaneous Q8H  . insulin aspart  0-15 Units Subcutaneous TID WC  . insulin aspart  0-5 Units Subcutaneous QHS  . insulin aspart  12 Units Subcutaneous TID WC  . insulin  glargine  55 Units Subcutaneous QHS  . levothyroxine  50 mcg Oral QAC breakfast  . metoprolol succinate  50 mg Oral Daily  . omega-3 acid ethyl esters  2 g Oral BID  . polyvinyl alcohol  1 drop Both Eyes QID  . potassium chloride  10 mEq Oral Daily  . simvastatin  20 mg Oral q1800  . sodium chloride  3 mL Intravenous Q12H  . spironolactone  25 mg Oral Daily  . warfarin  10 mg Oral ONCE-1800  . [COMPLETED] warfarin  7.5 mg Oral ONCE-1800  . Warfarin - Pharmacist Dosing Inpatient   Does not apply q1800  . [DISCONTINUED] insulin aspart  0-15 Units Subcutaneous TID WC  . [DISCONTINUED] insulin aspart  0-5 Units Subcutaneous QHS  . [DISCONTINUED] insulin aspart  0-9 Units Subcutaneous TID WC  . [DISCONTINUED] insulin aspart  10 Units Subcutaneous TID WC  . [DISCONTINUED] insulin glargine  50 Units Subcutaneous QHS   Continuous Infusions:   Principal Problem:  *Diastolic CHF, acute on chronic Active  Problems:  HTN (hypertension)  Hypothyroidism  DM2 (diabetes mellitus, type 2)  Atrial fibrillation  CKD (chronic kidney disease), stage III  Pneumonia    Time spent: 30 minutes    [facility]  [facility] Pager [phone]. If 8PM-8AM, please contact night-coverage at [email or web address], password [facility] 07/02/2012, 3:51 PM  LOS: 3 days

## 2012-07-02 NOTE — Progress Notes (Signed)
ANTICOAGULATION CONSULT NOTE  Pharmacy Consult:  Coumadin Indication:  Atrial fibrillation  No Known Allergies  Patient Measurements: Height: 5\' 6"  (167.6 cm) Weight: 230 lb 2.6 oz (104.4 kg) IBW/kg (Calculated) : 59.3   Vital Signs: Temp: 98.5 F (36.9 C) (11/16 1104) Temp src: Oral (11/16 0531) BP: 157/57 mmHg (11/16 1104) Pulse Rate: 76  (11/16 1104)  Labs:  Basename 07/02/12 0550 07/01/12 0435 06/30/12 0610  HGB 9.2* 9.2* --  HCT 28.8* 28.3* 27.4*  PLT 254 247 237  APTT -- -- --  LABPROT 16.3* 15.9* 15.9*  INR 1.34 1.30 1.30  HEPARINUNFRC -- -- --  CREATININE 1.82* 1.71* 1.87*  CKTOTAL -- -- --  CKMB -- -- --  TROPONINI -- -- --    Estimated Creatinine Clearance: 28.1 ml/min (by C-G formula based on Cr of 1.82).   Assessment: INR 1.82 in this 65 YOF with history of atrial fibrillation resumed on Coumadin therapy.  INR currently sub-therapeutic because patient stopped Coumadin on 06/22/12 for a tooth extraction planned for 06/27/12.  Dental procedure was not done and patient resumed Coumadin on 06/28/12.  She has taken 10-7.5-7.5 mg doses. Per documentation, patient's anemia appears new this admit, CBC stable, pltc 254K.  No bleeding noted. Coumadin dose PTA is 7.5mg  qTWRSS and 5mg  qMF.  Continues on SQ heparin for VTE prophylaxis.  Azithromycin started 06/20/12-which may increase coumadin's effect-will monitor. Stool hemoccult ordered 11/13 in computer as "needs to be collected".   Goal of Therapy:  INR 2-3    Plan:  - Coumadin 10 mg PO today - Continue heparin SQ until INR therapeutic - Daily PT / INR - Monitor for s/sx of bleeding  Herby Abraham, Pharm.D. [phone] 07/02/2012 11:09 AM

## 2012-07-02 NOTE — Progress Notes (Signed)
Pt received phenergan at 2027 and was noted feeling better afterward with no further nausea. However noted very sleepy when reevaluated at 2110. Pt stated she was tired at that time. At 2300 pt noted hallucinating reaching for object not there, requiring extensive assistance with ADLs, and noted incontinent. Pt placed on bed alarm but no attempts made to get out of bed unassisted during the shift. This morning at 0600 pt noted alert to self and birthday, but she could not remember where she was or the date. Pt awake enough to answer questions and take meds, but then when back to sleep immediately. MD notified with concern for Phenergan as allergy for pt.

## 2012-07-02 NOTE — Progress Notes (Addendum)
Subjective:  Symptoms of dyspnea markedly improved. No fever spikes.   Objective:  Vital Signs in the last 24 hours: Temp:  [98.1 F (36.7 C)-99.5 F (37.5 C)] 99.5 F (37.5 C) (11/16 0531) Pulse Rate:  [53-85] 53  (11/16 0531) Resp:  [16-20] 16  (11/16 0531) BP: (151-155)/(44-61) 154/44 mmHg (11/16 0531) SpO2:  [90 %-97 %] 90 % (11/16 0531) Weight:  [104.4 kg (230 lb 2.6 oz)] 104.4 kg (230 lb 2.6 oz) (11/16 0531)  Intake/Output from previous day: 11/15 0701 - 11/16 0700 In: 490 [P.O.:480; IV Piggyback:10] Out: 300 [Urine:300]  Physical Exam:   General appearance: alert, cooperative, appears stated age, no distress, moderately obese and pale Eyes: conjunctivae/corneas clear. PERRL, EOM's intact. Fundi benign. Neck: no adenopathy, no carotid bruit, no JVD, supple, symmetrical, trachea midline and thyroid not enlarged, symmetric, no tenderness/mass/nodules Neck: JVP - normal, carotids 2+= without bruits Resp: bilateral rhonchi, left infraclavicular area was crepitus, crackles heard on change from yesterday. Chest wall: no tenderness Cardio: S1, S2 normal and 2/6 systolic ejection murmur heard in the apex and right sternal border. GI: soft, non-tender; bowel sounds normal; no masses,  no organomegaly Extremities: edema 2 plus and Homans sign is negative, no sign of DVT    Lab Results:  Basename 07/02/12 0550 07/01/12 0435  WBC 12.3* 7.8  HGB 9.2* 9.2*  PLT 254 247    Basename 07/02/12 0550 07/01/12 0435  NA 134* 133*  K 4.1 3.7  CL 95* 95*  CO2 31 26  GLUCOSE 253* 290*  BUN 35* 37*  CREATININE 1.82* 1.71*   No results found for this basename: TROPONINI:2,CK,MB:2 in the last 72 hours Hepatic Function Panel  Basename 06/29/12 1610  PROT 7.8  ALBUMIN 3.1*  AST 19  ALT 17  ALKPHOS 45  BILITOT 0.4  BILIDIR --  IBILI --    Basename 06/30/12 0610  CHOL 170   No results found for this basename: PROTIME in the last 72 hours Lipid Panel     Component Value  Date/Time   CHOL 170 06/30/2012 0610   TRIG 163* 06/30/2012 0610   HDL 25* 06/30/2012 0610   CHOLHDL 6.8 06/30/2012 0610   VLDL 33 06/30/2012 0610   LDLCALC 112* 06/30/2012 0610     Imaging: Imaging results have been reviewed  Cardiac Studies:  EKG: Atrial fibrillation with controlled ventricular response.  Left ventricle hypertrophy.  Repolarization abnormality cannot rule out lateral wall ischemia.  Normal QT interval.  Tele: A. Fib with controlled V response.  BNP (last 3 results)  Basename 07/01/12 0435 06/29/12 1610  PROBNP 3840.0* 4620.0*     Assessment/Plan:  1.  Acute on chronic diastolic heart failure.  Symptoms unchanged from yesterday.  Still patient feels short-winded but much better. BNP down. Continue IV lasix for now. Cr stable. Continue aldactone. Switch to Bumetanide tomorrow if dyspnea improved. Echocardiogram done today reveals hyperdynamic left ventricle with evidence of elevated left atrial pressure.  Mild aortic stenosis. 2.  Pneumonia and UTI per primary team.  3. A. Fib rate controlled. INR managed by pharmacy and still subtherapeutic.  Pamella Pert, M.D. 07/02/2012, 10:49 AM [facility] Pager: [phone] Office: [phone] If no answer: [phone]

## 2012-07-02 NOTE — Progress Notes (Signed)
RT went in to place patient on CPAP. PT has home device in room and stated that she would put herself on her machine. RT will continue to monitor.

## 2012-07-02 NOTE — Progress Notes (Signed)
Pt brady down to 35-45 more than twice since 1300. Pt asym and sleeping easy to wake up. Will continue to monitor. Put Pt on 3L O2 d/t sleeping without CPAP at present time. MD aware.

## 2012-07-03 LAB — BASIC METABOLIC PANEL
Calcium: 9.1 mg/dL (ref 8.4–10.5)
GFR calc Af Amer: 26 mL/min — ABNORMAL LOW (ref >90)
GFR calc non Af Amer: 23 mL/min — ABNORMAL LOW (ref >90)
Glucose, Bld: 169 mg/dL — ABNORMAL HIGH (ref 70–99)
Sodium: 138 mEq/L (ref 135–145)

## 2012-07-03 LAB — PROTIME-INR: Prothrombin Time: 17.6 seconds — ABNORMAL HIGH (ref 11.6–15.2)

## 2012-07-03 MED ORDER — WARFARIN SODIUM 10 MG PO TABS
10.0000 mg | ORAL_TABLET | Freq: Once | ORAL | Status: DC
  Filled 2012-07-03: qty 1

## 2012-07-03 MED ORDER — INSULIN ASPART 100 UNIT/ML ~~LOC~~ SOLN: 15.0000 [IU] | Freq: Three times a day (TID) | SUBCUTANEOUS | Status: DC

## 2012-07-03 MED ORDER — SIMVASTATIN 20 MG PO TABS: 20.0000 mg | ORAL_TABLET | Freq: Every day | ORAL | Status: DC

## 2012-07-03 MED ORDER — AZITHROMYCIN 250 MG PO TABS: 250.0000 mg | ORAL_TABLET | Freq: Every day | ORAL | Status: DC

## 2012-07-03 MED ORDER — BUMETANIDE 2 MG PO TABS: 4.0000 mg | ORAL_TABLET | Freq: Two times a day (BID) | ORAL | Status: DC

## 2012-07-03 MED ORDER — INSULIN GLARGINE 100 UNIT/ML ~~LOC~~ SOLN: 55.0000 [IU] | Freq: Every day | SUBCUTANEOUS | Status: DC

## 2012-07-03 MED ORDER — RIVAROXABAN 15 MG PO TABS
15.0000 mg | ORAL_TABLET | Freq: Every day | ORAL | Status: DC
  Filled 2012-07-03: qty 1

## 2012-07-03 MED ORDER — AMLODIPINE BESYLATE 10 MG PO TABS: 10.0000 mg | ORAL_TABLET | Freq: Every day | ORAL | Status: AC

## 2012-07-03 MED ORDER — RIVAROXABAN 15 MG PO TABS: 15.0000 mg | ORAL_TABLET | Freq: Every day | ORAL | Status: DC

## 2012-07-03 NOTE — Progress Notes (Signed)
SATURATION QUALIFICATIONS: 07-03-12  Patient Saturations on Room Air at Rest = 87%  Patient Saturations on Room Air while Ambulating = 86%  Patient Saturations on 2L rs of oxygen while Ambulating = 93  Statement of medical necessity for home oxygen: Pt O2 <88% when ambulating

## 2012-07-03 NOTE — Progress Notes (Signed)
Physical Therapy Evaluation Patient Details Name: [name] MRN: [id number] DOB: 16-Sep-1925 Today's Date: 07/03/2012 Time: 1478-2956 PT Time Calculation (min): 44 min  PT Assessment / Plan / Recommendation Clinical Impression  78 yo female admitted with CHF/pna presents with decr functional mobility, decr activity tol; Will benefit form PT to maximize independence and safety with mobility; Will follow while in-house, however likely dc today; Son/family can provide assist initially at home; Recommending HHPT/OT/RN/Aide for follow up as pt lives alone and needs further assist/therapies for mobility and ADLs    PT Assessment  Patient needs continued PT services    Follow Up Recommendations  Home health PT;Supervision/Assistance - 24 hour;Supervision - Intermittent (also HHOT/RN, and does pt qualify for Aide?)    Does the patient have the potential to tolerate intense rehabilitation      Barriers to Discharge Decreased caregiver support Lives alone, though son/family can increase assist to pt    Equipment Recommendations  Other (comment) (Supplemental O2)    Recommendations for Other Services     Frequency Min 3X/week    Precautions / Restrictions Precautions Precautions: Fall (while in hosp) Precaution Comments: Recommend rW when up Restrictions Weight Bearing Restrictions: No   Pertinent Vitals/Pain Session conducted on 2 L O2; sats were 96% or greater post amb        Mobility  Bed Mobility Bed Mobility: Supine to Sit;Sitting - Scoot to Edge of Bed Supine to Sit: 5: Supervision;With rails Sitting - Scoot to Edge of Bed: 5: Supervision Details for Bed Mobility Assistance: Bed elevated to approximate home; somewhat inefficient movement, but able to get up without physical assist Transfers Transfers: Sit to Stand;Stand to Sit Sit to Stand: 4: Min guard;With upper extremity assist (without physical contact) Stand to Sit: 5: Supervision;With upper extremity  assist;With armrests;To chair/3-in-1 Details for Transfer Assistance: Dependent on UE push for successful sit to stand, which is indicative of generalized weakness; cues for safety and control of descent to chair Ambulation/Gait Ambulation/Gait Assistance: 4: Min guard (without physical contact) Ambulation Distance (Feet): 100 Feet Assistive device: Straight cane;Rolling walker Ambulation/Gait Assistance Details: Cues for safety and self-monitor for activity tol; Used straight cane initially, noted pt is tending to reach out for bil UE support, so provided pt with RW, which improved her gait steadiness and efficiency; Pt is hesitant to use RW for fear of not being abl eto control it; Gave her verbal and demo cues for optimal use of RW as a RW will be the best assistive device for energy conservation Gait Pattern: Decreased stride length    Shoulder Instructions     Exercises     PT Diagnosis: Difficulty walking;Generalized weakness  PT Problem List: Decreased strength;Decreased activity tolerance;Decreased balance;Decreased mobility;Decreased knowledge of use of DME;Cardiopulmonary status limiting activity PT Treatment Interventions: DME instruction;Gait training;Stair training;Functional mobility training;Therapeutic activities;Therapeutic exercise;Patient/family education   PT Goals Acute Rehab PT Goals PT Goal Formulation: With patient Time For Goal Achievement: 07/10/12 Potential to Achieve Goals: Good Pt will go Supine/Side to Sit: with modified independence PT Goal: Supine/Side to Sit - Progress: Goal set today Pt will go Sit to Supine/Side: with modified independence PT Goal: Sit to Supine/Side - Progress: Goal set today Pt will go Sit to Stand: with modified independence PT Goal: Sit to Stand - Progress: Goal set today Pt will go Stand to Sit: with modified independence PT Goal: Stand to Sit - Progress: Goal set today Pt will Ambulate: >150 feet;with modified independence;with  rolling walker PT Goal: Ambulate -  Progress: Goal set today Pt will Go Up / Down Stairs: 1-2 stairs;with modified independence;with rolling walker PT Goal: Up/Down Stairs - Progress: Goal set today  Visit Information  Last PT Received On: 07/03/12    Subjective Data  Subjective: Wanting to go home Patient Stated Goal: Home   Prior Functioning  Home Living Lives With: Alone Available Help at Discharge: Family;Available PRN/intermittently (son states he can stay overnight if necessary) Type of Home: House Home Access: Stairs to enter [employer] of Steps: 1 Entrance Stairs-Rails: None Home Layout: One level Bathroom Shower/Tub: Armed forces training and education officer: Yes Home Adaptive Equipment: Shower chair with back;Walker - rolling;Wheelchair - manual;Bedside commode/3-in-1 Prior Function Level of Independence: Independent with assistive device(s) (cane prn) Driving: Yes Vocation: Other (comment) 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    Cognition  Overall Cognitive Status: Appears within functional limits for tasks assessed/performed Arousal/Alertness: Awake/alert Orientation Level: Appears intact for tasks assessed Behavior During Session: [facility] for tasks performed    Extremity/Trunk Assessment Right Upper Extremity Assessment RUE ROM/Strength/Tone: [facility] for tasks assessed Left Upper Extremity Assessment LUE ROM/Strength/Tone: WFL for tasks assessed Right Lower Extremity Assessment RLE ROM/Strength/Tone: Deficits RLE ROM/Strength/Tone Deficits: Generally weak Left Lower Extremity Assessment LLE ROM/Strength/Tone: Deficits LLE ROM/Strength/Tone Deficits: Generally weak   Balance    End of Session PT - End of Session Equipment Utilized During Treatment: Gait belt;Oxygen Activity Tolerance: Patient tolerated treatment well;Patient limited by fatigue Patient left: in chair;with call bell/phone within reach;with family/visitor  present Nurse Communication: Mobility status  GP     Van Clines [facility] [place], [place] [phone]  07/03/2012, 2:04 PM

## 2012-07-03 NOTE — Progress Notes (Signed)
   CARE MANAGEMENT NOTE 07/03/2012  Patient:  [name], [name]   Account Number:  [id number]  Date Initiated:  07/03/2012  Documentation initiated by:  [facility]  Subjective/Objective Assessment:   Diastolic CHF, acute on chronic     Action/Plan:   HH needed. Shary Decamp contact # [phone], [phone]   Anticipated DC Date:  07/03/2012   Anticipated DC Plan:  HOME W HOME HEALTH SERVICES      DC Planning Services  CM consult      [facility] Choice  HOME HEALTH   Choice offered to / List presented to:  C-1 Patient   DME arranged  [facility]      DME agency  [facility]     [facility] arranged  HH-1 RN  HH-2 PT  HH-3 OT  HH-4 NURSE'S AIDE      HH agency  [facility]   Status of service:  Completed, signed off Medicare Important Message given?   (If response is "NO", the following Medicare IM given date fields will be blank) Date Medicare IM given:   Date Additional Medicare IM given:    Discharge Disposition:  HOME W HOME HEALTH SERVICES  Per UR Regulation:    If discussed at Long Length of Stay Meetings, dates discussed:    Comments:  07/03/2012 1430 NCM contacted [facility] for home oxygen for scheduled dc today. Explained to dtr to call [facility] when she arrives home to deliver home oxygen. NCM added contact [facility] contact info to d/c instructions. Isidoro Donning RN CCM Case Mgmt phone [phone]  Elliot Cousin, RN Case Manager Signed CASE MANAGEMENT Progress Notes 07/03/2012 2:05 PM NCM spoke to pt's daughter, she assist with pt at home. Pt has RW and cane at home. Pt had [facility] in the past for [facility] and requesting their services. NCM notified Genevieve Norlander of d/c home today with [facility] and needing lab drawn on 11/19. Contacted [facility] on [address] and they do have Xarelto in stock. NCM made pt aware she would have to activate card on Monday for 10 day free trial of Xarelto. [facility] added to dc instructions.   Isidoro Donning RN CCM Case Mgmt phone  [phone]

## 2012-07-03 NOTE — Progress Notes (Addendum)
ANTICOAGULATION CONSULT NOTE  Pharmacy Consult:  Coumadin Indication:  Atrial fibrillation  No Known Allergies  Patient Measurements: Height: 5\' 6"  (167.6 cm) Weight: 225 lb 5 oz (102.2 kg) IBW/kg (Calculated) : 59.3   Vital Signs: Temp: 97.9 F (36.6 C) (11/17 0415) Temp src: Oral (11/17 0415) BP: 143/67 mmHg (11/17 0830) Pulse Rate: 78  (11/17 0830)  Labs:  Basename 07/03/12 0735 07/02/12 0550 07/01/12 0435  HGB -- 9.2* 9.2*  HCT -- 28.8* 28.3*  PLT -- 254 247  APTT -- -- --  LABPROT 17.6* 16.3* 15.9*  INR 1.49 1.34 1.30  HEPARINUNFRC -- -- --  CREATININE 1.93* 1.82* 1.71*  CKTOTAL -- -- --  CKMB -- -- --  TROPONINI -- -- --    Estimated Creatinine Clearance: 26.2 ml/min (by C-G formula based on Cr of 1.93).   Assessment: INR down still today (1.49) in this 60 YOF with history of atrial fibrillation resumed on Coumadin therapy.  INR currently sub-therapeutic because patient stopped Coumadin on 06/22/12 for a tooth extraction planned for 06/27/12.  Dental procedure was not done and patient resumed Coumadin on 06/28/12.  She has taken 10-7.5-7.5 mg and 10mg  doses per documentation.  No CBC this AM but H/H appears stable, pltc 254K.  No bleeding noted.   Occult stool results was negative for bleed.    HOME Dose:  Coumadin dose PTA is 7.5mg  qTWRSS and 5mg  qMF.   Drug/Drug Interaction:  Azithromycin started 06/20/12 which may increase coumadin's effect-will monitor.   Mechanism: azithromycin may inhibit hepatic metabolism of warfarin. Additional mechanisms may exist.  Goal of Therapy:  INR 2-3   Plan:  - Coumadin 10 mg PO today - Continue heparin SQ until INR therapeutic - Daily PT / INR - Monitor for s/sx of bleeding  Nadara Mustard, PharmD., MS Clinical Pharmacist Pager:  [phone] Thank you for allowing pharmacy to be part of this patients care team. 07/03/2012 10:40 AM

## 2012-07-03 NOTE — Progress Notes (Signed)
Pt d/c home w/daughter. D/c instructions and medication reviewed with pt and daughter. Pt and daughter state understanding. All Pt and daughter's questions answered.

## 2012-07-03 NOTE — Discharge Summary (Addendum)
Physician Discharge Summary  [name]:[id number] DOB: November 13, 1925 DOA: 06/29/2012  PCP: Aida Puffer, MD  Admit date: 06/29/2012 Discharge date: 07/03/2012  Time spent: Greater than 30 minutes  Recommendations for Outpatient Follow-up:  1. With Dr. Delrae Rend, Cardiologist in one week from hospital discharge. 2. With Dr. Aida Puffer, PCP in one week from hospital discharge. 3. Recommend repeating chest x-ray in a couple of weeks to ensure resolution of pneumonia findings. 4. Home health RN will draw BMP on 11/19 and forward results to PCP for review.  Discharge Diagnoses:  Principal Problem:  *Diastolic CHF, acute on chronic Active Problems:  HTN (hypertension)  Hypothyroidism  DM2 (diabetes mellitus, type 2)  Atrial fibrillation  CKD (chronic kidney disease), stage III  Pneumonia  Nausea and vomiting   Discharge Condition: Improved and stable.  Diet recommendation: Diabetic and heart healthy  Filed Weights   07/01/12 0438 07/02/12 0531 07/03/12 0415  Weight: 107.911 kg (237 lb 14.4 oz) 104.4 kg (230 lb 2.6 oz) 102.2 kg (225 lb 5 oz)    History of present illness:  78 year old female patient with history of diabetes, diastolic CHF, chronic atrial fibrillation, chronic leg edema, obesity, CKD. was admitted on 11/13 with three-week history of shortness of breath and peripheral edema that has worsened in the past 2 days. The patient had some subjective fevers, but did not take her temperature. There were no chills or rigors. The patient denies any coughing, hemoptysis, dysuria, hematuria, abdominal pain, dizziness chest pain. She has not had any orthopnea or PND. The patient wears CPAP at night. She does not know her settings.  Of note, the patient states that her primary care provider decreased her Bumex 2 mg twice a day to Lasix 40 mg twice a day approximately 3 weeks ago. He was during this period of time, the patient states that she has had increasing  shortness of breath and peripheral edema. The patient states that she has been compliant with her medications. She does not weigh herself daily. Denies weight loss, hematochezia, melena. She states that her appetite has been poor in the past few weeks.   Hospital Course:  1. Acute on chronic diastolic congestive heart failure: Clinically improved. Cardiology was consulted. It was felt that patient's acute decompensation was due to possible pneumonia, reduced dose of diuretics as outpatient in worsening anemia. She was placed on IV Lasix and diuresed appropriately with improvement in her condition. Discussed with her cardiologist recommends discharging home on bumetanide 4 mg twice a day-apparently does not do well on Lasix. 2. Community acquired Pneumonia: Patient was treated empirically with IV Rocephin and Z-Pak. She denies any cough or fevers. No dyspnea. She will complete the Z-Pak course. Recommend repeating chest x-ray in a couple of weeks to ensure resolution. 3. Permanent atrial fibrillation: Controlled ventricular rate. INR is subtherapeutic. Continue digoxin and metoprolol. Digoxin level was 0.8. Patient was getting Coumadin per pharmacy. Discussed with her primary cardiologist today recommends discontinuing Coumadin and starting her on Xarelto 15 mg by mouth daily. 4. Uncontrolled DM 2: Hemoglobin A1c 8.3 indicates poor outpatient control. Adjustments are made to patient's insulins. These have to be further adjusted as outpatient for improved control. Metformin discontinued secondary to elevated creatinine. 5. CKD. stage III: Monitor BMP closely while on diuretics. Creatinine may be at baseline. 6. Normocytic anemia: Hemoglobin in March was 11.4. He'll the hemoglobin has been in the 9.1-9.6 g/dL range. No overt bleeding. Stool for occult blood was negative x1. Consider outpatient evaluation as deemed  necessary.  7. OSA: Nightly CPAP. 8. Hypertension: Patient has fluctuating blood pressures.  Clonidine was discontinued secondary to bradycardic episodes . Amlodipine had been held but will be resumed on discharge. Not a candidate for ACE inhibitor/ARB secondary to chronic kidney disease. May need to titrate/adjust blood pressure medications as outpatient. 9. Nausea and vomiting: Unclear etiology. Spontaneously resolved. 10. Hypoxemia/acute hypoxic respiratory failure secondary to problem #1 and 2: O2 sats with activity on room air were 86%. Patient will go home on oxygen.  Procedures:  None  Consultations:  Cardiology  Discharge Exam:  Complaints Patient denies dyspnea, cough or chest pain.  Filed Vitals:   07/02/12 2149 07/03/12 0415 07/03/12 0830 07/03/12 1049  BP: 157/55 186/47 143/67 181/49  Pulse: 79 68 78 64  Temp: 98.2 F (36.8 C) 97.9 F (36.6 C)    TempSrc: Oral Oral    Resp: 18 18 18    Height:      Weight:  102.2 kg (225 lb 5 oz)    SpO2: 95% 94% 85%     General exam: Comfortable  Respiratory system: Occasional crackles in the bases.Rest of lung fields are clear to auscultation. No increased work of breathing.  Cardiovascular system: S1 and S2 heard, irregular. Telemetry shows A. fib with controlled ventricular rate. Trace pedal edema.  Gastrointestinal system: Abdomen is nondistended, soft and nontender. Normal bowel sounds heard.  Central nervous system: Alert and oriented. No focal neurological deficits.  Extremities: Symmetric 5 x 5 power.  Discharge Instructions      Discharge Orders    Future Orders Please Complete By Expires   Diet - low sodium heart healthy      Diet Carb Modified      Increase activity slowly      Discharge instructions      Comments:   Continue CPAP at bedtime. Oxygen via nasal cannula at 2 L per minute continuously.   Call MD for:  temperature >100.4      Call MD for:  difficulty breathing, headache or visual disturbances      (HEART FAILURE PATIENTS) Call MD:  Anytime you have any of the following symptoms: 1) 3  pound weight gain in 24 hours or 5 pounds in 1 week 2) shortness of breath, with or without a dry hacking cough 3) swelling in the hands, feet or stomach 4) if you have to sleep on extra pillows at night in order to breathe.      Call MD for:  extreme fatigue          Medication List     As of 07/03/2012  2:19 PM    STOP taking these medications         cloNIDine 0.1 MG tablet   Commonly known as: CATAPRES      furosemide 40 MG tablet   Commonly known as: LASIX      metFORMIN 500 MG tablet   Commonly known as: GLUCOPHAGE      warfarin 5 MG tablet   Commonly known as: COUMADIN      TAKE these medications         amLODipine 10 MG tablet   Commonly known as: NORVASC   Take 1 tablet (10 mg total) by mouth daily.      aspirin 81 MG tablet   Take 81 mg by mouth daily.      azithromycin 250 MG tablet   Commonly known as: ZITHROMAX   Take 1 tablet (250 mg total) by mouth daily.  Biotin 5000 MCG Tabs   Take by mouth daily.      brimonidine 0.1 % Soln   Commonly known as: ALPHAGAN P   Place 1 drop into both eyes every 12 (twelve) hours.      bumetanide 2 MG tablet   Commonly known as: BUMEX   Take 2 tablets (4 mg total) by mouth 2 (two) times daily.      CALCIUM 600 + D PO   Take 1 tablet by mouth daily.      CENTRUM SILVER PO   Take 1 tablet by mouth daily.      chlordiazePOXIDE 5 MG capsule   Commonly known as: LIBRIUM   Take 5 mg by mouth 2 (two) times daily as needed.      cyanocobalamin 500 MCG tablet   Take 500 mcg by mouth daily.      cycloSPORINE 0.05 % ophthalmic emulsion   Commonly known as: RESTASIS   Place 1 drop into both eyes every 12 (twelve) hours.      digoxin 0.125 MG tablet   Commonly known as: LANOXIN   Take 125 mcg by mouth daily.      ergocalciferol 50000 UNITS capsule   Commonly known as: VITAMIN D2   Take 50,000 Units by mouth once a week. On Saturdays      insulin aspart 100 UNIT/ML injection   Commonly known as: novoLOG    Inject 15 Units into the skin 3 (three) times daily with meals.      insulin glargine 100 UNIT/ML injection   Commonly known as: LANTUS   Inject 55 Units into the skin daily.      levothyroxine 50 MCG tablet   Commonly known as: SYNTHROID, LEVOTHROID   Take 50 mcg by mouth daily.      LUMIGAN 0.01 % Soln   Generic drug: bimatoprost   Place 1 drop into both eyes at bedtime.      metoprolol succinate 50 MG 24 hr tablet   Commonly known as: TOPROL-XL   Take 50 mg by mouth daily. Take with or immediately following a meal.      omega-3 acid ethyl esters 1 G capsule   Commonly known as: LOVAZA   Take 2 g by mouth 2 (two) times daily.      potassium chloride 10 MEQ tablet   Commonly known as: K-DUR   Take 10 mEq by mouth daily.      Rivaroxaban 15 MG Tabs tablet   Commonly known as: XARELTO   Take 1 tablet (15 mg total) by mouth daily.      simvastatin 20 MG tablet   Commonly known as: ZOCOR   Take 1 tablet (20 mg total) by mouth daily at 6 PM.      spironolactone 25 MG tablet   Commonly known as: ALDACTONE   Take 25 mg by mouth daily.      SYSTANE OP   Place 1-4 drops into both eyes 4 (four) times daily as needed. For dry eyes         Follow-up Information    Follow up with Pamella Pert, MD. Schedule an appointment as soon as possible for a visit in 1 week.   Contact information:   [address] [place] [address] [phone]       Follow up with Aida Puffer, MD. Schedule an appointment as soon as possible for a visit in 1 week.   Contact information:   [address]  [place] [address] [phone]       Follow up with [facility]. [facility] RN, Physical Therapy, aide, and  Occupational Therapy)    Contact information:   [phone]          The results of significant diagnostics from this hospitalization (including imaging, microbiology, ancillary and laboratory) are listed below for reference.    Significant  Diagnostic Studies: Dg Chest 2 View  06/30/2012  *RADIOLOGY REPORT*  Clinical Data: Follow up of congestive heart failure and possible superimposed edema.  CHEST - 2 VIEW  Comparison: Two-view chest 06/29/2012.  Findings: The heart is at the upper limits of normal for size. Patchy bilateral interstitial and airspace disease is present. There is slight improved aeration on the left.  No new consolidation is evident.  Small bilateral pleural effusions persist.  The visualized soft tissues and bony thorax are unremarkable.  IMPRESSION:  1.  Slight improved aeration on the left.  Edema is still favored. 2.  Superimposed infection cannot be excluded.  There is no significant worsening. 3.  Atherosclerosis.   Original Report Authenticated By: Marin Roberts, M.D.    Dg Chest 2 View  06/29/2012  *RADIOLOGY REPORT*  Clinical Data: Shortness of breath.  CHEST - 2 VIEW  Comparison: Chest x-ray 03/24/2010.  Findings: Compared to the prior examination and there is a new multifocal bilateral air space disease which is predominately perihilar (left greater than right) in location.  There is some associated cephalization of the pulmonary vasculature, indistinctness of the interstitial markings and Kerley B lines, suggestive that these findings may be related to pulmonary edema. Trace bilateral pleural effusions.  Heart size is mildly enlarged. The patient is rotated to the left on today's exam, resulting in distortion of the mediastinal contours and reduced diagnostic sensitivity and specificity for mediastinal pathology. Atherosclerosis of the thoracic aorta.  IMPRESSION: 1.  Findings, as above, concerning for congestive heart failure. 2.  Given the relatively extensive airspace disease that is slightly asymmetric involving the left upper lobe to a greater extent than the remainder of the lungs, the possibility of superimposed acute infection is not excluded.  Clinical correlation is recommended. 3.  Atherosclerosis.    Original Report Authenticated By: Trudie Reed, M.D.    2-D echo  Study Conclusions  - Left ventricle: The cavity size was normal. There was mild focal basal and moderate concentric hypertrophy of the septum. Systolic function was vigorous. The estimated ejection fraction was in the range of 65% to 70%. Wall motion was normal; there were no regional wall motion abnormalities. Doppler parameters are consistent with high ventricular filling pressure. - Aortic valve: There was mild stenosis. Valve area: 1.36cm^2(VTI). Valve area: 1.49cm^2 (Vmax). - Mitral valve: Calcified annulus. Mildly thickened leaflets. Mild regurgitation. - Left atrium: The atrium was moderately dilated. - Right atrium: The atrium was mildly dilated. - Atrial septum: No defect or patent foramen ovale was identified.   Microbiology: No results found for this or any previous visit (from the past 240 hour(s)).   Labs: Basic Metabolic Panel:  Lab 07/03/12 1308 07/02/12 0550 07/01/12 0435 06/30/12 0610 06/29/12 1610  NA 138 134* 133* 138 135  K 3.5 4.1 3.7 4.1 4.5  CL 94* 95* 95* 99 99  CO2 31 31 26 26 21   GLUCOSE 169* 253* 290* 246* 278*  BUN 38* 35* 37* 43* 45*  CREATININE 1.93* 1.82* 1.71* 1.87* 1.92*  CALCIUM 9.1 9.4 9.4 9.1 9.3  MG -- -- -- -- --  PHOS -- -- -- -- --   Liver Function Tests:  Lab 06/29/12 1610  AST 19  ALT 17  ALKPHOS 45  BILITOT 0.4  PROT 7.8  ALBUMIN 3.1*   No results found for this basename: LIPASE:5,AMYLASE:5 in the last 168 hours No results found for this basename: AMMONIA:5 in the last 168 hours CBC:  Lab 07/02/12 0550 07/01/12 0435 06/30/12 0610 06/29/12 1610  WBC 12.3* 7.8 7.6 8.7  NEUTROABS -- -- -- 6.3  HGB 9.2* 9.2* 9.1* 9.6*  HCT 28.8* 28.3* 27.4* 29.7*  MCV 88.1 87.1 88.7 87.9  PLT 254 247 237 267   Cardiac Enzymes: No results found for this basename: CKTOTAL:5,CKMB:5,CKMBINDEX:5,TROPONINI:5 in the last 168 hours BNP: BNP (last 3 results)  Basename  07/01/12 0435 06/29/12 1610  PROBNP 3840.0* 4620.0*   CBG:  Lab 07/03/12 1127 07/03/12 0656 07/02/12 2117 07/02/12 1619 07/02/12 1056  GLUCAP 222* 171* 176* 241* 179*    Other lab data   Lipid panel: Cholesterol 170, triglycerides 163, HDL 25, LDL 112, VLDL 33  INR: 1.49  Digoxin level: 0.8  Hemoglobin A1c: 8.3  TSH: 3.037  Stool for occult blood x1: Negative   Signed:  Midge Momon  [facility] 07/03/2012, 2:19 PM

## 2012-07-03 NOTE — Progress Notes (Signed)
Ambulated Pt on RA Pt stat 86% for 257ft.   95% at rest on 2L 87% on RA at rest  86% ambulated 241ft on RA 278ft  Pt recovered from ambulation 93% on 2L

## 2012-07-03 NOTE — Progress Notes (Signed)
NCM spoke to pt's daughter, she assist with pt at home. Pt has RW and cane at home. Pt had [facility] in the past for [facility] and requesting their services. NCM notified Genevieve Norlander of d/c home today with [facility] and needing lab drawn on 11/19. Contacted [facility] on [address] and they do have Xarelto in stock. NCM made pt aware she would have to activate card on Monday for 10 day free trial of Xarelto. [facility] added to dc instructions.   Isidoro Donning RN CCM Case Mgmt phone [phone]

## 2012-08-03 ENCOUNTER — Other Ambulatory Visit: Payer: Self-pay | Admitting: Family Medicine

## 2012-08-03 ENCOUNTER — Ambulatory Visit
Admission: RE | Admit: 2012-08-03 | Discharge: 2012-08-03 | Disposition: A | Discharge status: Home / Self Care | Payer: Medicare Other | Source: Ambulatory Visit | Attending: Family Medicine | Admitting: Family Medicine

## 2012-08-03 DIAGNOSIS — M79604 Pain in right leg: Secondary | ICD-10-CM

## 2012-09-08 ENCOUNTER — Other Ambulatory Visit: Payer: Self-pay | Admitting: Orthopedic Surgery

## 2012-09-08 DIAGNOSIS — M545 Low back pain, unspecified: Secondary | ICD-10-CM

## 2012-09-08 DIAGNOSIS — M25551 Pain in right hip: Secondary | ICD-10-CM

## 2012-09-14 ENCOUNTER — Other Ambulatory Visit: Payer: Medicare Other

## 2012-09-15 ENCOUNTER — Ambulatory Visit
Admission: RE | Admit: 2012-09-15 | Discharge: 2012-09-15 | Disposition: A | Discharge status: Home / Self Care | Payer: Medicare Other | Source: Ambulatory Visit | Attending: Orthopedic Surgery | Admitting: Orthopedic Surgery

## 2012-09-15 DIAGNOSIS — M545 Low back pain: Secondary | ICD-10-CM

## 2012-09-15 DIAGNOSIS — M25551 Pain in right hip: Secondary | ICD-10-CM

## 2013-03-06 ENCOUNTER — Encounter (INDEPENDENT_AMBULATORY_CARE_PROVIDER_SITE_OTHER): Payer: Medicare Other | Admitting: Ophthalmology

## 2013-03-06 DIAGNOSIS — H35039 Hypertensive retinopathy, unspecified eye: Secondary | ICD-10-CM

## 2013-03-06 DIAGNOSIS — E1165 Type 2 diabetes mellitus with hyperglycemia: Secondary | ICD-10-CM

## 2013-03-06 DIAGNOSIS — H3581 Retinal edema: Secondary | ICD-10-CM

## 2013-03-06 DIAGNOSIS — I10 Essential (primary) hypertension: Secondary | ICD-10-CM

## 2013-03-06 DIAGNOSIS — E11319 Type 2 diabetes mellitus with unspecified diabetic retinopathy without macular edema: Secondary | ICD-10-CM

## 2013-03-06 DIAGNOSIS — H43819 Vitreous degeneration, unspecified eye: Secondary | ICD-10-CM

## 2013-03-23 ENCOUNTER — Other Ambulatory Visit (INDEPENDENT_AMBULATORY_CARE_PROVIDER_SITE_OTHER): Payer: Medicare Other | Admitting: Ophthalmology

## 2013-03-23 DIAGNOSIS — H3581 Retinal edema: Secondary | ICD-10-CM

## 2013-04-03 ENCOUNTER — Other Ambulatory Visit (INDEPENDENT_AMBULATORY_CARE_PROVIDER_SITE_OTHER): Payer: Medicare Other | Admitting: Ophthalmology

## 2013-04-03 DIAGNOSIS — H3581 Retinal edema: Secondary | ICD-10-CM

## 2013-08-07 ENCOUNTER — Ambulatory Visit (INDEPENDENT_AMBULATORY_CARE_PROVIDER_SITE_OTHER): Payer: Medicare Other | Admitting: Ophthalmology

## 2013-08-07 DIAGNOSIS — E1139 Type 2 diabetes mellitus with other diabetic ophthalmic complication: Secondary | ICD-10-CM

## 2013-08-07 DIAGNOSIS — H35039 Hypertensive retinopathy, unspecified eye: Secondary | ICD-10-CM

## 2013-08-07 DIAGNOSIS — E11319 Type 2 diabetes mellitus with unspecified diabetic retinopathy without macular edema: Secondary | ICD-10-CM

## 2013-08-07 DIAGNOSIS — I10 Essential (primary) hypertension: Secondary | ICD-10-CM

## 2013-08-07 DIAGNOSIS — H43819 Vitreous degeneration, unspecified eye: Secondary | ICD-10-CM

## 2013-12-19 ENCOUNTER — Ambulatory Visit
Admission: RE | Admit: 2013-12-19 | Discharge: 2013-12-19 | Disposition: A | Discharge status: Home / Self Care | Payer: Medicare Other | Source: Ambulatory Visit | Attending: Family Medicine | Admitting: Family Medicine

## 2013-12-19 ENCOUNTER — Other Ambulatory Visit: Payer: Self-pay | Admitting: Family Medicine

## 2013-12-19 DIAGNOSIS — W19XXXA Unspecified fall, initial encounter: Secondary | ICD-10-CM

## 2014-01-29 ENCOUNTER — Ambulatory Visit (INDEPENDENT_AMBULATORY_CARE_PROVIDER_SITE_OTHER): Payer: Medicare Other | Admitting: Ophthalmology

## 2014-01-29 DIAGNOSIS — I10 Essential (primary) hypertension: Secondary | ICD-10-CM

## 2014-01-29 DIAGNOSIS — H34219 Partial retinal artery occlusion, unspecified eye: Secondary | ICD-10-CM

## 2014-01-29 DIAGNOSIS — E1139 Type 2 diabetes mellitus with other diabetic ophthalmic complication: Secondary | ICD-10-CM

## 2014-01-29 DIAGNOSIS — E1165 Type 2 diabetes mellitus with hyperglycemia: Secondary | ICD-10-CM

## 2014-01-29 DIAGNOSIS — E11319 Type 2 diabetes mellitus with unspecified diabetic retinopathy without macular edema: Secondary | ICD-10-CM

## 2014-01-29 DIAGNOSIS — H35039 Hypertensive retinopathy, unspecified eye: Secondary | ICD-10-CM

## 2014-01-29 DIAGNOSIS — H43819 Vitreous degeneration, unspecified eye: Secondary | ICD-10-CM

## 2014-01-30 ENCOUNTER — Ambulatory Visit: Payer: Medicare Other | Admitting: Physical Therapy

## 2014-01-31 ENCOUNTER — Ambulatory Visit: Payer: Medicare Other | Attending: Orthopaedic Surgery | Admitting: Physical Therapy

## 2014-01-31 DIAGNOSIS — M25519 Pain in unspecified shoulder: Secondary | ICD-10-CM | POA: Insufficient documentation

## 2014-01-31 DIAGNOSIS — M25619 Stiffness of unspecified shoulder, not elsewhere classified: Secondary | ICD-10-CM | POA: Insufficient documentation

## 2014-01-31 DIAGNOSIS — IMO0001 Reserved for inherently not codable concepts without codable children: Secondary | ICD-10-CM | POA: Insufficient documentation

## 2014-02-05 ENCOUNTER — Ambulatory Visit (INDEPENDENT_AMBULATORY_CARE_PROVIDER_SITE_OTHER): Payer: Medicare Other | Admitting: Ophthalmology

## 2014-02-06 ENCOUNTER — Ambulatory Visit: Payer: Medicare Other | Admitting: Physical Therapy

## 2014-02-06 DIAGNOSIS — IMO0001 Reserved for inherently not codable concepts without codable children: Secondary | ICD-10-CM | POA: Diagnosis not present

## 2014-02-08 ENCOUNTER — Ambulatory Visit: Payer: Medicare Other | Admitting: Physical Therapy

## 2014-02-08 DIAGNOSIS — IMO0001 Reserved for inherently not codable concepts without codable children: Secondary | ICD-10-CM | POA: Diagnosis not present

## 2014-02-13 ENCOUNTER — Ambulatory Visit: Payer: Medicare Other | Admitting: Physical Therapy

## 2014-02-13 DIAGNOSIS — IMO0001 Reserved for inherently not codable concepts without codable children: Secondary | ICD-10-CM | POA: Diagnosis not present

## 2014-02-15 ENCOUNTER — Ambulatory Visit: Payer: Medicare Other | Attending: Orthopaedic Surgery | Admitting: Physical Therapy

## 2014-02-15 DIAGNOSIS — M25619 Stiffness of unspecified shoulder, not elsewhere classified: Secondary | ICD-10-CM | POA: Diagnosis not present

## 2014-02-15 DIAGNOSIS — M25519 Pain in unspecified shoulder: Secondary | ICD-10-CM | POA: Diagnosis not present

## 2014-02-15 DIAGNOSIS — IMO0001 Reserved for inherently not codable concepts without codable children: Secondary | ICD-10-CM | POA: Diagnosis not present

## 2014-02-20 ENCOUNTER — Ambulatory Visit: Payer: Medicare Other | Admitting: Physical Therapy

## 2014-02-20 DIAGNOSIS — IMO0001 Reserved for inherently not codable concepts without codable children: Secondary | ICD-10-CM | POA: Diagnosis not present

## 2014-02-22 ENCOUNTER — Ambulatory Visit: Payer: Medicare Other | Admitting: Physical Therapy

## 2014-02-22 DIAGNOSIS — IMO0001 Reserved for inherently not codable concepts without codable children: Secondary | ICD-10-CM | POA: Diagnosis not present

## 2014-02-27 ENCOUNTER — Ambulatory Visit: Payer: Medicare Other | Admitting: Physical Therapy

## 2014-02-27 DIAGNOSIS — IMO0001 Reserved for inherently not codable concepts without codable children: Secondary | ICD-10-CM | POA: Diagnosis not present

## 2014-03-01 ENCOUNTER — Ambulatory Visit: Payer: Medicare Other | Admitting: Physical Therapy

## 2014-03-01 DIAGNOSIS — IMO0001 Reserved for inherently not codable concepts without codable children: Secondary | ICD-10-CM | POA: Diagnosis not present

## 2014-03-06 ENCOUNTER — Ambulatory Visit: Payer: Medicare Other | Admitting: Physical Therapy

## 2014-03-06 DIAGNOSIS — IMO0001 Reserved for inherently not codable concepts without codable children: Secondary | ICD-10-CM | POA: Diagnosis not present

## 2014-03-08 ENCOUNTER — Ambulatory Visit: Payer: Medicare Other | Admitting: Physical Therapy

## 2014-03-08 DIAGNOSIS — IMO0001 Reserved for inherently not codable concepts without codable children: Secondary | ICD-10-CM | POA: Diagnosis not present

## 2014-03-13 ENCOUNTER — Ambulatory Visit: Payer: Medicare Other | Admitting: Physical Therapy

## 2014-03-13 DIAGNOSIS — IMO0001 Reserved for inherently not codable concepts without codable children: Secondary | ICD-10-CM | POA: Diagnosis not present

## 2014-03-15 ENCOUNTER — Ambulatory Visit: Payer: Medicare Other | Admitting: Physical Therapy

## 2014-03-15 DIAGNOSIS — IMO0001 Reserved for inherently not codable concepts without codable children: Secondary | ICD-10-CM | POA: Diagnosis not present

## 2014-03-20 ENCOUNTER — Ambulatory Visit: Payer: Medicare Other | Attending: Orthopaedic Surgery | Admitting: Physical Therapy

## 2014-03-20 DIAGNOSIS — M25619 Stiffness of unspecified shoulder, not elsewhere classified: Secondary | ICD-10-CM | POA: Insufficient documentation

## 2014-03-20 DIAGNOSIS — M25519 Pain in unspecified shoulder: Secondary | ICD-10-CM | POA: Insufficient documentation

## 2014-03-20 DIAGNOSIS — IMO0001 Reserved for inherently not codable concepts without codable children: Secondary | ICD-10-CM | POA: Diagnosis present

## 2014-03-22 ENCOUNTER — Ambulatory Visit: Payer: Medicare Other | Admitting: Physical Therapy

## 2014-03-22 DIAGNOSIS — IMO0001 Reserved for inherently not codable concepts without codable children: Secondary | ICD-10-CM | POA: Diagnosis not present

## 2014-03-27 ENCOUNTER — Ambulatory Visit: Payer: Medicare Other | Admitting: Physical Therapy

## 2014-03-27 DIAGNOSIS — IMO0001 Reserved for inherently not codable concepts without codable children: Secondary | ICD-10-CM | POA: Diagnosis not present

## 2014-03-29 ENCOUNTER — Ambulatory Visit: Payer: Medicare Other | Admitting: Physical Therapy

## 2014-03-29 DIAGNOSIS — IMO0001 Reserved for inherently not codable concepts without codable children: Secondary | ICD-10-CM | POA: Diagnosis not present

## 2014-04-02 ENCOUNTER — Ambulatory Visit: Payer: Medicare Other | Admitting: Physical Therapy

## 2014-04-02 DIAGNOSIS — IMO0001 Reserved for inherently not codable concepts without codable children: Secondary | ICD-10-CM | POA: Diagnosis not present

## 2014-04-05 ENCOUNTER — Ambulatory Visit: Payer: Medicare Other | Admitting: Physical Therapy

## 2014-04-05 DIAGNOSIS — IMO0001 Reserved for inherently not codable concepts without codable children: Secondary | ICD-10-CM | POA: Diagnosis not present

## 2014-04-10 ENCOUNTER — Ambulatory Visit: Payer: Medicare Other | Admitting: Physical Therapy

## 2014-04-10 DIAGNOSIS — IMO0001 Reserved for inherently not codable concepts without codable children: Secondary | ICD-10-CM | POA: Diagnosis not present

## 2014-04-12 ENCOUNTER — Ambulatory Visit: Payer: Medicare Other | Admitting: Physical Therapy

## 2014-04-13 ENCOUNTER — Ambulatory Visit: Payer: Medicare Other | Admitting: Physical Therapy

## 2014-04-17 ENCOUNTER — Ambulatory Visit: Payer: Medicare Other | Attending: Orthopaedic Surgery | Admitting: Physical Therapy

## 2014-04-17 DIAGNOSIS — M25619 Stiffness of unspecified shoulder, not elsewhere classified: Secondary | ICD-10-CM | POA: Diagnosis not present

## 2014-04-17 DIAGNOSIS — IMO0001 Reserved for inherently not codable concepts without codable children: Secondary | ICD-10-CM | POA: Diagnosis not present

## 2014-04-17 DIAGNOSIS — M25519 Pain in unspecified shoulder: Secondary | ICD-10-CM | POA: Diagnosis not present

## 2014-04-19 ENCOUNTER — Ambulatory Visit: Payer: Medicare Other | Admitting: Physical Therapy

## 2014-04-19 DIAGNOSIS — IMO0001 Reserved for inherently not codable concepts without codable children: Secondary | ICD-10-CM | POA: Diagnosis not present

## 2014-08-06 ENCOUNTER — Ambulatory Visit (INDEPENDENT_AMBULATORY_CARE_PROVIDER_SITE_OTHER): Payer: Medicare Other | Admitting: Ophthalmology

## 2014-08-06 DIAGNOSIS — I10 Essential (primary) hypertension: Secondary | ICD-10-CM

## 2014-08-06 DIAGNOSIS — H35033 Hypertensive retinopathy, bilateral: Secondary | ICD-10-CM

## 2014-08-06 DIAGNOSIS — H43813 Vitreous degeneration, bilateral: Secondary | ICD-10-CM

## 2014-08-06 DIAGNOSIS — E11319 Type 2 diabetes mellitus with unspecified diabetic retinopathy without macular edema: Secondary | ICD-10-CM

## 2014-08-06 DIAGNOSIS — E11329 Type 2 diabetes mellitus with mild nonproliferative diabetic retinopathy without macular edema: Secondary | ICD-10-CM

## 2014-11-30 ENCOUNTER — Encounter (HOSPITAL_COMMUNITY): Payer: Self-pay | Admitting: Emergency Medicine

## 2014-11-30 ENCOUNTER — Inpatient Hospital Stay (HOSPITAL_COMMUNITY)
Admission: EM | Admit: 2014-11-30 | Discharge: 2014-12-03 | DRG: 292 | Disposition: A | Discharge status: Home Health | Payer: Medicare Other | Attending: Oncology | Admitting: Oncology

## 2014-11-30 ENCOUNTER — Emergency Department (HOSPITAL_COMMUNITY): Payer: Medicare Other

## 2014-11-30 DIAGNOSIS — Z794 Long term (current) use of insulin: Secondary | ICD-10-CM | POA: Diagnosis not present

## 2014-11-30 DIAGNOSIS — Z79899 Other long term (current) drug therapy: Secondary | ICD-10-CM | POA: Diagnosis not present

## 2014-11-30 DIAGNOSIS — H409 Unspecified glaucoma: Secondary | ICD-10-CM | POA: Diagnosis present

## 2014-11-30 DIAGNOSIS — E785 Hyperlipidemia, unspecified: Secondary | ICD-10-CM | POA: Diagnosis present

## 2014-11-30 DIAGNOSIS — R0602 Shortness of breath: Secondary | ICD-10-CM | POA: Diagnosis present

## 2014-11-30 DIAGNOSIS — I10 Essential (primary) hypertension: Secondary | ICD-10-CM | POA: Diagnosis present

## 2014-11-30 DIAGNOSIS — E1122 Type 2 diabetes mellitus with diabetic chronic kidney disease: Secondary | ICD-10-CM | POA: Diagnosis not present

## 2014-11-30 DIAGNOSIS — G4733 Obstructive sleep apnea (adult) (pediatric): Secondary | ICD-10-CM | POA: Diagnosis present

## 2014-11-30 DIAGNOSIS — Z9119 Patient's noncompliance with other medical treatment and regimen: Secondary | ICD-10-CM | POA: Diagnosis present

## 2014-11-30 DIAGNOSIS — D631 Anemia in chronic kidney disease: Secondary | ICD-10-CM | POA: Diagnosis present

## 2014-11-30 DIAGNOSIS — E780 Pure hypercholesterolemia: Secondary | ICD-10-CM | POA: Diagnosis present

## 2014-11-30 DIAGNOSIS — E669 Obesity, unspecified: Secondary | ICD-10-CM | POA: Diagnosis present

## 2014-11-30 DIAGNOSIS — Z6837 Body mass index (BMI) 37.0-37.9, adult: Secondary | ICD-10-CM

## 2014-11-30 DIAGNOSIS — I350 Nonrheumatic aortic (valve) stenosis: Secondary | ICD-10-CM | POA: Diagnosis present

## 2014-11-30 DIAGNOSIS — I482 Chronic atrial fibrillation, unspecified: Secondary | ICD-10-CM | POA: Insufficient documentation

## 2014-11-30 DIAGNOSIS — N183 Chronic kidney disease, stage 3 unspecified: Secondary | ICD-10-CM | POA: Diagnosis present

## 2014-11-30 DIAGNOSIS — I5033 Acute on chronic diastolic (congestive) heart failure: Secondary | ICD-10-CM | POA: Diagnosis present

## 2014-11-30 DIAGNOSIS — Z7901 Long term (current) use of anticoagulants: Secondary | ICD-10-CM | POA: Diagnosis not present

## 2014-11-30 DIAGNOSIS — E039 Hypothyroidism, unspecified: Secondary | ICD-10-CM | POA: Diagnosis present

## 2014-11-30 DIAGNOSIS — I272 Other secondary pulmonary hypertension: Secondary | ICD-10-CM | POA: Diagnosis present

## 2014-11-30 DIAGNOSIS — I509 Heart failure, unspecified: Secondary | ICD-10-CM

## 2014-11-30 DIAGNOSIS — N179 Acute kidney failure, unspecified: Secondary | ICD-10-CM | POA: Diagnosis present

## 2014-11-30 DIAGNOSIS — E119 Type 2 diabetes mellitus without complications: Secondary | ICD-10-CM

## 2014-11-30 DIAGNOSIS — Z7982 Long term (current) use of aspirin: Secondary | ICD-10-CM

## 2014-11-30 DIAGNOSIS — I4891 Unspecified atrial fibrillation: Secondary | ICD-10-CM | POA: Diagnosis present

## 2014-11-30 DIAGNOSIS — I129 Hypertensive chronic kidney disease with stage 1 through stage 4 chronic kidney disease, or unspecified chronic kidney disease: Secondary | ICD-10-CM | POA: Diagnosis present

## 2014-11-30 DIAGNOSIS — D638 Anemia in other chronic diseases classified elsewhere: Secondary | ICD-10-CM

## 2014-11-30 DIAGNOSIS — E1165 Type 2 diabetes mellitus with hyperglycemia: Secondary | ICD-10-CM | POA: Diagnosis present

## 2014-11-30 DIAGNOSIS — Z8673 Personal history of transient ischemic attack (TIA), and cerebral infarction without residual deficits: Secondary | ICD-10-CM

## 2014-11-30 LAB — CBC
HEMATOCRIT: 31.9 % — AB (ref 36.0–46.0)
Hemoglobin: 10.6 g/dL — ABNORMAL LOW (ref 12.0–15.0)
MCH: 28.8 pg (ref 26.0–34.0)
MCHC: 33.2 g/dL (ref 30.0–36.0)
MCV: 86.7 fL (ref 78.0–100.0)
Platelets: 203 10*3/uL (ref 150–400)
RBC: 3.68 MIL/uL — ABNORMAL LOW (ref 3.87–5.11)
RDW: 14.7 % (ref 11.5–15.5)
WBC: 11 10*3/uL — ABNORMAL HIGH (ref 4.0–10.5)

## 2014-11-30 LAB — URINALYSIS, ROUTINE W REFLEX MICROSCOPIC
Bilirubin Urine: NEGATIVE
Glucose, UA: 100 mg/dL — AB
Hgb urine dipstick: NEGATIVE
KETONES UR: NEGATIVE mg/dL
LEUKOCYTES UA: NEGATIVE
NITRITE: NEGATIVE
PROTEIN: NEGATIVE mg/dL
Specific Gravity, Urine: 1.009 (ref 1.005–1.030)
UROBILINOGEN UA: 0.2 mg/dL (ref 0.0–1.0)
pH: 5.5 (ref 5.0–8.0)

## 2014-11-30 LAB — GLUCOSE, CAPILLARY
Glucose-Capillary: 299 mg/dL — ABNORMAL HIGH (ref 70–99)
Glucose-Capillary: 325 mg/dL — ABNORMAL HIGH (ref 70–99)

## 2014-11-30 LAB — IRON AND TIBC
IRON: 38 ug/dL — AB (ref 42–145)
SATURATION RATIOS: 12 % — AB (ref 20–55)
TIBC: 310 ug/dL (ref 250–470)
UIBC: 272 ug/dL (ref 125–400)

## 2014-11-30 LAB — BASIC METABOLIC PANEL
Anion gap: 15 (ref 5–15)
BUN: 70 mg/dL — AB (ref 6–23)
CALCIUM: 8.7 mg/dL (ref 8.4–10.5)
CO2: 23 mmol/L (ref 19–32)
Chloride: 96 mmol/L (ref 96–112)
Creatinine, Ser: 2.62 mg/dL — ABNORMAL HIGH (ref 0.50–1.10)
GFR calc Af Amer: 18 mL/min — ABNORMAL LOW (ref >90)
GFR calc non Af Amer: 15 mL/min — ABNORMAL LOW (ref >90)
GLUCOSE: 359 mg/dL — AB (ref 70–99)
Potassium: 4.4 mmol/L (ref 3.5–5.1)
SODIUM: 134 mmol/L — AB (ref 135–145)

## 2014-11-30 LAB — RETICULOCYTES
RBC.: 3.6 MIL/uL — ABNORMAL LOW (ref 3.87–5.11)
RETIC COUNT ABSOLUTE: 90 10*3/uL (ref 19.0–186.0)
Retic Ct Pct: 2.5 % (ref 0.4–3.1)

## 2014-11-30 LAB — BRAIN NATRIURETIC PEPTIDE: B NATRIURETIC PEPTIDE 5: 455.4 pg/mL — AB (ref 0.0–100.0)

## 2014-11-30 LAB — FERRITIN: FERRITIN: 42 ng/mL (ref 10–291)

## 2014-11-30 LAB — I-STAT TROPONIN, ED: TROPONIN I, POC: 0.04 ng/mL (ref 0.00–0.08)

## 2014-11-30 LAB — TROPONIN I

## 2014-11-30 LAB — CBG MONITORING, ED: GLUCOSE-CAPILLARY: 239 mg/dL — AB (ref 70–99)

## 2014-11-30 LAB — PROTIME-INR
INR: 1.11 (ref 0.00–1.49)
PROTHROMBIN TIME: 14.4 s (ref 11.6–15.2)

## 2014-11-30 LAB — VITAMIN B12: VITAMIN B 12: 1141 pg/mL — AB (ref 211–911)

## 2014-11-30 LAB — DIGOXIN LEVEL: Digoxin Level: <2 ng/mL (ref 0.8–2.0)

## 2014-11-30 LAB — FOLATE: Folate: >20 ng/mL

## 2014-11-30 MED ORDER — AMLODIPINE BESYLATE 10 MG PO TABS
10.0000 mg | ORAL_TABLET | Freq: Every day | ORAL | Status: DC
  Administered 2014-11-30 – 2014-12-03 (×4): 10 mg via ORAL
  Filled 2014-11-30 (×4): qty 1

## 2014-11-30 MED ORDER — BRIMONIDINE TARTRATE 0.2 % OP SOLN
1.0000 [drp] | Freq: Two times a day (BID) | OPHTHALMIC | Status: DC
  Administered 2014-11-30 – 2014-12-03 (×7): 1 [drp] via OPHTHALMIC
  Filled 2014-11-30: qty 5

## 2014-11-30 MED ORDER — METOPROLOL SUCCINATE ER 50 MG PO TB24
50.0000 mg | ORAL_TABLET | Freq: Every day | ORAL | Status: DC
  Administered 2014-11-30 – 2014-12-03 (×4): 50 mg via ORAL
  Filled 2014-11-30 (×4): qty 1

## 2014-11-30 MED ORDER — INSULIN ASPART 100 UNIT/ML ~~LOC~~ SOLN
0.0000 [IU] | Freq: Every day | SUBCUTANEOUS | Status: DC
  Administered 2014-12-01: 4 [IU] via SUBCUTANEOUS

## 2014-11-30 MED ORDER — SODIUM CHLORIDE 0.9 % IJ SOLN: 3.0000 mL | INTRAMUSCULAR | Status: DC | PRN

## 2014-11-30 MED ORDER — SODIUM CHLORIDE 0.9 % IJ SOLN
3.0000 mL | Freq: Two times a day (BID) | INTRAMUSCULAR | Status: DC
  Administered 2014-11-30 – 2014-12-01 (×4): 3 mL via INTRAVENOUS

## 2014-11-30 MED ORDER — INSULIN ASPART 100 UNIT/ML ~~LOC~~ SOLN
0.0000 [IU] | Freq: Three times a day (TID) | SUBCUTANEOUS | Status: DC
  Administered 2014-12-01: 2 [IU] via SUBCUTANEOUS
  Administered 2014-12-01: 3 [IU] via SUBCUTANEOUS
  Administered 2014-12-02 (×2): 2 [IU] via SUBCUTANEOUS
  Administered 2014-12-03: 3 [IU] via SUBCUTANEOUS

## 2014-11-30 MED ORDER — HEPARIN (PORCINE) IN NACL 100-0.45 UNIT/ML-% IJ SOLN
1300.0000 [IU]/h | INTRAMUSCULAR | Status: DC
  Administered 2014-11-30: 1100 [IU]/h via INTRAVENOUS
  Administered 2014-12-02 – 2014-12-03 (×2): 1300 [IU]/h via INTRAVENOUS
  Filled 2014-11-30 (×3): qty 250

## 2014-11-30 MED ORDER — ACETAMINOPHEN 325 MG PO TABS
650.0000 mg | ORAL_TABLET | ORAL | Status: DC | PRN
Start: 2014-11-30 — End: 2014-12-01
  Administered 2014-11-30 – 2014-12-01 (×2): 650 mg via ORAL
  Filled 2014-11-30 (×2): qty 2

## 2014-11-30 MED ORDER — LATANOPROST 0.005 % OP SOLN
1.0000 [drp] | Freq: Every day | OPHTHALMIC | Status: DC
  Administered 2014-12-01: 1 [drp] via OPHTHALMIC
  Filled 2014-11-30: qty 2.5

## 2014-11-30 MED ORDER — FUROSEMIDE 10 MG/ML IJ SOLN
40.0000 mg | Freq: Two times a day (BID) | INTRAMUSCULAR | Status: DC
  Administered 2014-11-30 – 2014-12-02 (×4): 40 mg via INTRAVENOUS
  Filled 2014-11-30 (×4): qty 4

## 2014-11-30 MED ORDER — SODIUM CHLORIDE 0.9 % IV SOLN: 250.0000 mL | INTRAVENOUS | Status: DC | PRN

## 2014-11-30 MED ORDER — INSULIN GLARGINE 100 UNIT/ML ~~LOC~~ SOLN
40.0000 [IU] | Freq: Every day | SUBCUTANEOUS | Status: DC
  Administered 2014-11-30 – 2014-12-02 (×3): 40 [IU] via SUBCUTANEOUS
  Filled 2014-11-30 (×4): qty 0.4

## 2014-11-30 MED ORDER — INSULIN ASPART 100 UNIT/ML ~~LOC~~ SOLN
0.0000 [IU] | Freq: Three times a day (TID) | SUBCUTANEOUS | Status: DC
  Administered 2014-11-30: 3 [IU] via SUBCUTANEOUS
  Administered 2014-11-30: 5 [IU] via SUBCUTANEOUS
  Filled 2014-11-30: qty 1

## 2014-11-30 MED ORDER — LEVOTHYROXINE SODIUM 50 MCG PO TABS
50.0000 ug | ORAL_TABLET | Freq: Every day | ORAL | Status: DC
  Administered 2014-12-01 – 2014-12-03 (×3): 50 ug via ORAL
  Filled 2014-11-30 (×4): qty 1

## 2014-11-30 MED ORDER — FUROSEMIDE 10 MG/ML IJ SOLN
40.0000 mg | Freq: Once | INTRAMUSCULAR | Status: AC
  Administered 2014-11-30: 40 mg via INTRAVENOUS
  Filled 2014-11-30: qty 4

## 2014-11-30 MED ORDER — WARFARIN - PHARMACIST DOSING INPATIENT
Freq: Every day | Status: DC
  Administered 2014-12-02: 18:00:00

## 2014-11-30 MED ORDER — WARFARIN SODIUM 10 MG PO TABS
10.0000 mg | ORAL_TABLET | Freq: Once | ORAL | Status: AC
  Administered 2014-11-30: 10 mg via ORAL
  Filled 2014-11-30: qty 1

## 2014-11-30 MED ORDER — ONDANSETRON HCL 4 MG/2ML IJ SOLN
4.0000 mg | Freq: Four times a day (QID) | INTRAMUSCULAR | Status: DC | PRN
  Filled 2014-11-30: qty 2

## 2014-11-30 MED ORDER — HEPARIN BOLUS VIA INFUSION
3000.0000 [IU] | Freq: Once | INTRAVENOUS | Status: AC
  Administered 2014-11-30: 3000 [IU] via INTRAVENOUS
  Filled 2014-11-30: qty 3000

## 2014-11-30 NOTE — ED Notes (Signed)
Bedside report given to [facility], RN.  Belongings at bedside with family.

## 2014-11-30 NOTE — H&P (Signed)
Date: 11/30/2014               Patient Name:  [name] MRN: [id number]  DOB: [birth year] Age / Sex: 81 y.o., female   PCP: Tamsen Roers, MD              Medical Service: Internal Medicine Teaching Service              Attending Physician: Dr. Bertha Stakes, MD    First Contact: Dr. Randell Patient Pager: [phone]  Second Contact: Dr. Naaman Plummer Pager: [phone]            After Hours (After 5p/  First Contact Pager: [phone]  weekends / holidays): Second Contact Pager: [phone]   Chief Complaint: DOE  History of Present Illness: This is an 81 year old woman with past medical history of DM 2, diastolic CHF, chronic atrial fibrillation, obesity, CKD stage 3-4, Anemia, who presents to [facility] with increasing dyspnea on exertion over the past several weeks. She states that she has notes that she gets more short of breath with minimal exertion and she also reports increased bilateral lower leg extremity. Denies cough or other pulmonary symptoms. She has no orthopnea. No chest pain, no palpitations. She's been compliant with her home diuretic therapy. He denies dietary indiscretions.  No constitutional symptoms. In the ED she was found to have increased interstitial edema on chest x-ray and therefore, the teaching service was consulted for admission for further therapy.  Review of Systems: Constitutional: No fever, chills, diaphoresis, appetite change and fatigue.  HEENT: No URI symptoms. No neck pain or photophobia. Gastrointestinal: No N/V or diarrhea. No abdominal pain. No hematochezia or melena.  Genitourinary: No dysuria, urgency, frequency, hematuria, flank pain and difficulty urinating.  Musculoskeletal: Received corticosteroid injection in the hip followed by by mouth Prednisone for one week. She uses a walking cane. No myalgias, back pain.  Skin: No rash and wound. No easy bruising. Neurological: No dizziness, seizures, syncope, weakness, LH, numbness and headaches.  Psychiatric/Behavioral:  No SI, mood changes, confusion, nervousness, sleep disturbance and agitation  Meds: No current facility-administered medications for this encounter.   Current Outpatient Prescriptions  Medication Sig Dispense Refill  . amLODipine (NORVASC) 10 MG tablet Take 1 tablet (10 mg total) by mouth daily. 30 tablet 0  . bumetanide (BUMEX) 2 MG tablet Take 2 tablets (4 mg total) by mouth 2 (two) times daily. 60 tablet 0  . chlordiazePOXIDE (LIBRIUM) 5 MG capsule Take 5 mg by mouth 2 (two) times daily as needed.    . chlorthalidone (HYGROTON) 25 MG tablet Take 25 mg by mouth daily.  0  . gabapentin (NEURONTIN) 100 MG capsule Take 100 mg by mouth.  1  . insulin glargine (LANTUS SOLOSTAR) 100 UNIT/ML injection Inject 55 Units into the skin daily. (Patient taking differently: Inject 35 Units into the skin daily. ) 5 pen 0  . levothyroxine (SYNTHROID, LEVOTHROID) 50 MCG tablet Take 50 mcg by mouth daily.    . metoprolol succinate (TOPROL-XL) 50 MG 24 hr tablet Take 50 mg by mouth daily. Take with or immediately following a meal.    . predniSONE (DELTASONE) 5 MG tablet   0  . warfarin (COUMADIN) 5 MG tablet Take 5 mg by mouth daily. 7.5 mg every day except for Monday and Friday when you take 5 mg daily    . aspirin 81 MG tablet Take 81 mg by mouth daily.    . bimatoprost (LUMIGAN) 0.01 % SOLN Place 1 drop into both  eyes at bedtime.    . Biotin 5000 MCG TABS Take by mouth daily.    . brimonidine (ALPHAGAN P) 0.1 % SOLN Place 1 drop into both eyes every 12 (twelve) hours.    . Calcium Carbonate-Vitamin D (CALCIUM 600 + D PO) Take 1 tablet by mouth daily.    . cyanocobalamin 500 MCG tablet Take 500 mcg by mouth daily.    . cycloSPORINE (RESTASIS) 0.05 % ophthalmic emulsion Place 1 drop into both eyes every 12 (twelve) hours.    . digoxin (LANOXIN) 0.125 MG tablet Take 125 mcg by mouth daily.    . ergocalciferol (VITAMIN D2) 50000 UNITS capsule Take 50,000 Units by mouth once a week. On Saturdays    . insulin  aspart (NOVOLOG FLEXPEN) 100 UNIT/ML injection Inject 15 Units into the skin 3 (three) times daily with meals. 5 pen 0  . Multiple Vitamins-Minerals (CENTRUM SILVER PO) Take 1 tablet by mouth daily.    Marland Kitchen omega-3 acid ethyl esters (LOVAZA) 1 G capsule Take 2 g by mouth 2 (two) times daily.    Vladimir Faster Glycol-Propyl Glycol (SYSTANE OP) Place 1-4 drops into both eyes 4 (four) times daily as needed. For dry eyes    . potassium chloride (K-DUR) 10 MEQ tablet Take 10 mEq by mouth daily.      Allergies: Allergies as of 11/30/2014  . (No Known Allergies)   Past Medical History  Diagnosis Date  . Hypertension   . Glaucoma   . Atrial fibrillation   . Hypothyroidism   . Endometrial cancer   . TIA (transient ischemic attack) 10/2011    "that's questionable" (06/29/12)  . Hypercholesterolemia   . CHF (congestive heart failure)   . Pneumonia 1980's    "couple times" (06/29/12)  . Shortness of breath     "just moving around; yesterday & today" (06/29/12)  . OSA on CPAP   . H/O hiatal hernia     "years and years ago" (06/29/12)  . GERD (gastroesophageal reflux disease)     "several years ago" (06/29/12)  . Arthritis     "in my toes" (06/29/12)  . Type II diabetes mellitus 05/1994  . Fall at home 06/24/2012    "rolled out of bed getting alarm clark that had fallen on floor" (06/29/12)   Past Surgical History  Procedure Laterality Date  . Ankle fracture surgery  10/2007    right  . Abdominal hysterectomy  1996?  Marland Kitchen Cataract extraction w/ intraocular lens  implant, bilateral  2005?   History reviewed. No pertinent family history. History   Social History  . Marital Status: Divorced    Spouse Name: N/A  . Number of Children: N/A  . Years of Education: N/A   Occupational History  . Not on file.   Social History Main Topics  . Smoking status: Never Smoker   . Smokeless tobacco: Never Used  . Alcohol Use: No  . Drug Use: No  . Sexual Activity: Not Currently   Other Topics  Concern  . Not on file   Social History Narrative   Physical Exam: Blood pressure 151/86, pulse 92, temperature 98 F (36.7 C), temperature source Oral, resp. rate 16, height 5\' 6"  (1.676 m), weight 243 lb 1.6 oz (110.269 kg), SpO2 96 %.  General: well developed, well nourished; mild acute distress, cooperative with exam. Daughter in room. HEENT: Neck is supple. Head is Atraumatic. Normal EOM. Pupils equal, round and reactive; anicteric. Nose/throat: oropharynx clear, moist mucous membranes, pink gums  Lungs/Chest wall: CTA bil, normal work of breathing  Heart: irregular rhythm and rate; no murmurs. Noted JVD 3-4 cm Pulses: radial and dorsalis pedis pulses are 2+ and symmetric  Abdomen: Normal fullness, no rebound, guarding, or rigidity; nl BS; no palpable masses.  Skin: warm, dry, intact, normal turgor, no rashes  Extremities: +2 bilateral edema, no clubbing, or cyanosis Neurologic: A&O X3, CN II - XII are grossly intact. Strength 5/5 in the all 4 extremities, Sensation grossly intact. Psych: Normal mood and affect. Normal speech and behavior. No agitation  Lab results: Basic Metabolic Panel:  Recent Labs  11/30/14 0745  NA 134*  K 4.4  CL 96  CO2 23  GLUCOSE 359*  BUN 70*  CREATININE 2.62*  CALCIUM 8.7   CBC:  Recent Labs  11/30/14 0745  WBC 11.0*  HGB 10.6*  HCT 31.9*  MCV 86.7  PLT 203   Urinalysis:  Recent Labs  11/30/14 0934  COLORURINE YELLOW  LABSPEC 1.009  PHURINE 5.5  GLUCOSEU 100*  HGBUR NEGATIVE  BILIRUBINUR NEGATIVE  KETONESUR NEGATIVE  PROTEINUR NEGATIVE  UROBILINOGEN 0.2  NITRITE NEGATIVE  LEUKOCYTESUR NEGATIVE    Imaging results:  Dg Chest 2 View  11/30/2014   CLINICAL DATA:  Worsening shortness of breath, leg swelling  EXAM: CHEST  2 VIEW  COMPARISON:  06/30/2012  FINDINGS: There is bilateral diffuse reticular mild interstitial prominence suspicious for interstitial edema or pneumonitis. No segmental infiltrate. Mild hyperinflation.  Osteopenia and mild degenerative changes thoracic spine.  IMPRESSION: Diffuse bilateral reticular interstitial prominence suspicious for interstitial edema or pneumonitis. No segmental infiltrate. Mild hyperinflation.   Electronically Signed   By: Lahoma Crocker M.D.   On: 11/30/2014 08:21    Other results: EKG: atrial fibrillation, rate is 80 bpm and irregular. Diffuse T-wave inversions likely secondary to repolarization involving precordial lead. These are new compared to her prior EKG in 10/2101.  Assessment & Plan by Problem: Principal Problem:   Acute exacerbation of CHF (congestive heart failure) Active Problems:   HTN (hypertension)   Hypothyroidism   DM2 (diabetes mellitus, type 2)   Atrial fibrillation   CKD (chronic kidney disease), stage III  This is an 81 year old woman with past medical history of diastolic CHF, atrial fibrillation, hypertension and diabetes who presents with signs and symptoms of acute on chronic CHF exacerbation.   Acute on chronic dCHF: Symptoms and physical examination findings consistent with acute CHF exacerbation. She's been compliant with her oral diuretic therapy including Bumex 4mg  bid and Chlorthalidone 25 mg daily. She is off Digoxin and Spirinolactone. EKG with diffuse T-wave inversions likely repolarization. No chest pain and troponin is negative. Last echocardiogram 11/29/2011 revealed EF of 65, 70% but with increased ventricular filling pressures,? Diastolic dysfunction. Chest x-ray reveals interstitial edema. Discharge wt 225 in 2013, now weights 243lb. She lost 14 pounds with diuresis during her last admission in 2013. Est dry wt is around 225lb. Patient sees Dr Einar Gip of cardiology. Plan -Admit telemetry. -Hold home Bumex. -Start IV Lasix 40 mg twice a day -Monitor electrolytes and renal function, in and out, daily wts, low salt diet - D/c Chlorthalidone. Less effective with very poor renal function -Consider consulting Dr Einar Gip if balancing  diuretics and renal function becomes challenging, otherwise she can follow up as outpatient. -Cycle Troponins, EKG for chest pain  Permanent Atrial fibrillation: Rate controlled with Metoprolol. She was taken off Digoxin and Xarelto was switched to Coumadin. She takes 7.5 mg daily except on Mon and Fri when she  takes 5 mg daily. Plan -Continue with Coumadin per pharmacy. -Check INR. She states that she is usually therapeutic. -Continue with Metoprolol XL 50 mg daily  Hypertension: BP 150/86. No chest pain. Takes Amlodipine 10 mg daily, Metoprolol 50 mg daily, Bumex 4 mg twice a day and Chlorthalidone 25 mg daily. Plan -Discontinue Chlorthalidone  -Continue with Metoprolol XL 50 mg daily - Amlodipine 10 mg daily -Hold Bumex as above in problem #1. -IV Lasix 40 mg twice a day  DM type II: Last A1c in EMR 8.7% on 06/30/2012. She takes Lantus 35 units every morning. Has a prescription of NovoLog 3 times a day that she has not used it in the last several weeks. No hypoglycemia or hyperglycemia episodes. However, her blood sugar is above 300 in the ED. She is currently on Prednisone for the last 1 week. Plan -Lantus 40 units every morning -Sliding-scale insulin -CBG monitoring  CKD stage 3-4: Baseline creatinine around 1.8-1.9 based on results from 3 years ago. Cr 2.62 with BUN 70 (BL~38) today. No other electrolyte abnormalities. Cr bump likely due to acute CHF exacerbation. This could be progressive as well but this is difficult to determine without recent labs. U/A is normal. Plan  - Obtain results from her PCP regarding recent renal function. Office closed today. - will continue with diuresis and see if her renal function improves.  - consider further work up for AKI if she does not improve.  Anemia secondary to CKD: Hemoglobin 10.6. Baseline 8-9. likely due to chronic renal disease.  Plan -Anemia panel to rule out iron deficiency  Hypothyroidism: continue with Levothyroxine   Code  Status: Full code  F/E/N: Low salt with fluid restriction   VTE Ppx: On coumadin therapy   Family Communication: Discussed with patient and her daughter in room. All questions answered.  Dispo: Disposition is deferred at this time, awaiting improvement of current medical problems. Anticipated discharge in approximately 2-3 day(s).   The patient does have a current PCP Tamsen Roers, MD), therefore is not require OPC follow-up after discharge.   The patient does not have transportation limitations that hinder transportation to clinic appointments.   Signed:  Jessee Avers, MD PGY-3 Internal Medicine Teaching Service Pager [phone] (7a-7p) 11/30/2014, 10:41 AM

## 2014-11-30 NOTE — ED Notes (Signed)
Pt from home with worsening sob and increased leg swelling x 1 week.  Pt was seen by PCP last week prior to the worsening symptoms.  Hx CHF, denies pain. Pt in NAD, A&O.

## 2014-11-30 NOTE — ED Provider Notes (Signed)
CSN: [id number]     Arrival date & time 11/30/14  4696 History   First MD Initiated Contact with Patient 11/30/14 [phone]     Chief Complaint  Patient presents with  . Shortness of Breath     (Consider location/radiation/quality/duration/timing/severity/associated sxs/prior Treatment) Patient is a 81 y.o. female presenting with shortness of breath. The history is provided by the patient and medical records.  Shortness of Breath   This is an 81 year old female with past medical history significant for hypertension, A. fib, hypothyroidism, congestive heart failure, diabetes, presenting to the ED for shortness of breath and lower extremity edema over the past week.  Patient states shortness of breath is worse with exertional activities. She denies any chest pain. No recent cough, congestion, fever, or chills. Denies any increase in nighttime orthopnea. She has had some weight gain over the past 2 months.  Patient does not use supplemental oxygen. She is anticoagulated with xarelto.  Followed by cardiology- Dr. Nadyne Coombes.  2D echo in 2013 with estimated 65-70% EF.  Past Medical History  Diagnosis Date  . Hypertension   . Glaucoma   . Atrial fibrillation   . Hypothyroidism   . Endometrial cancer   . TIA (transient ischemic attack) 10/2011    "that's questionable" (06/29/12)  . Hypercholesterolemia   . CHF (congestive heart failure)   . Pneumonia 1980's    "couple times" (06/29/12)  . Shortness of breath     "just moving around; yesterday & today" (06/29/12)  . OSA on CPAP   . H/O hiatal hernia     "years and years ago" (06/29/12)  . GERD (gastroesophageal reflux disease)     "several years ago" (06/29/12)  . Arthritis     "in my toes" (06/29/12)  . Type II diabetes mellitus 05/1994  . Fall at home 06/24/2012    "rolled out of bed getting alarm clark that had fallen on floor" (06/29/12)   Past Surgical History  Procedure Laterality Date  . Ankle fracture surgery  10/2007    right  .  Abdominal hysterectomy  1996?  Marland Kitchen Cataract extraction w/ intraocular lens  implant, bilateral  2005?   History reviewed. No pertinent family history. History  Substance Use Topics  . Smoking status: Never Smoker   . Smokeless tobacco: Never Used  . Alcohol Use: No   OB History    No data available     Review of Systems  Respiratory: Positive for shortness of breath.   Cardiovascular: Positive for leg swelling (edema).  All other systems reviewed and are negative.     Allergies  Review of patient's allergies indicates no known allergies.  Home Medications   Prior to Admission medications   Medication Sig Start Date End Date Taking? Authorizing Provider  amLODipine (NORVASC) 10 MG tablet Take 1 tablet (10 mg total) by mouth daily. 07/03/12   Modena Jansky, MD  aspirin 81 MG tablet Take 81 mg by mouth daily.    Historical Provider, MD  azithromycin (ZITHROMAX) 250 MG tablet Take 1 tablet (250 mg total) by mouth daily. 07/03/12   Modena Jansky, MD  bimatoprost (LUMIGAN) 0.01 % SOLN Place 1 drop into both eyes at bedtime.    Historical Provider, MD  Biotin 5000 MCG TABS Take by mouth daily.    Historical Provider, MD  brimonidine (ALPHAGAN P) 0.1 % SOLN Place 1 drop into both eyes every 12 (twelve) hours.    Historical Provider, MD  bumetanide (BUMEX) 2 MG tablet Take 2  tablets (4 mg total) by mouth 2 (two) times daily. 07/03/12   Modena Jansky, MD  Calcium Carbonate-Vitamin D (CALCIUM 600 + D PO) Take 1 tablet by mouth daily.    Historical Provider, MD  chlordiazePOXIDE (LIBRIUM) 5 MG capsule Take 5 mg by mouth 2 (two) times daily as needed.    Historical Provider, MD  cyanocobalamin 500 MCG tablet Take 500 mcg by mouth daily.    Historical Provider, MD  cycloSPORINE (RESTASIS) 0.05 % ophthalmic emulsion Place 1 drop into both eyes every 12 (twelve) hours.    Historical Provider, MD  digoxin (LANOXIN) 0.125 MG tablet Take 125 mcg by mouth daily.    Historical Provider, MD   ergocalciferol (VITAMIN D2) 50000 UNITS capsule Take 50,000 Units by mouth once a week. On Saturdays    Historical Provider, MD  insulin aspart (NOVOLOG FLEXPEN) 100 UNIT/ML injection Inject 15 Units into the skin 3 (three) times daily with meals. 07/03/12   Modena Jansky, MD  insulin glargine (LANTUS SOLOSTAR) 100 UNIT/ML injection Inject 55 Units into the skin daily. 07/03/12   Modena Jansky, MD  levothyroxine (SYNTHROID, LEVOTHROID) 50 MCG tablet Take 50 mcg by mouth daily.    Historical Provider, MD  metoprolol succinate (TOPROL-XL) 50 MG 24 hr tablet Take 50 mg by mouth daily. Take with or immediately following a meal.    Historical Provider, MD  Multiple Vitamins-Minerals (CENTRUM SILVER PO) Take 1 tablet by mouth daily.    Historical Provider, MD  omega-3 acid ethyl esters (LOVAZA) 1 G capsule Take 2 g by mouth 2 (two) times daily.    Historical Provider, MD  Polyethyl Glycol-Propyl Glycol (SYSTANE OP) Place 1-4 drops into both eyes 4 (four) times daily as needed. For dry eyes    Historical Provider, MD  potassium chloride (K-DUR) 10 MEQ tablet Take 10 mEq by mouth daily.    Historical Provider, MD  Rivaroxaban (XARELTO) 15 MG TABS tablet Take 1 tablet (15 mg total) by mouth daily. 07/03/12   Modena Jansky, MD  simvastatin (ZOCOR) 20 MG tablet Take 1 tablet (20 mg total) by mouth daily at 6 PM. 07/03/12   Modena Jansky, MD  spironolactone (ALDACTONE) 25 MG tablet Take 25 mg by mouth daily.    Historical Provider, MD   BP 175/77 mmHg  Pulse 99  Temp(Src) 98 F (36.7 C) (Oral)  Resp 20  Ht 5\' 6"  (1.676 m)  SpO2 97%   Physical Exam  Constitutional: She is oriented to person, place, and time. She appears well-developed and well-nourished.  HENT:  Head: Normocephalic and atraumatic.  Mouth/Throat: Oropharynx is clear and moist.  Eyes: Conjunctivae and EOM are normal. Pupils are equal, round, and reactive to light.  Neck: Normal range of motion.  Cardiovascular: Normal rate  and normal heart sounds.  An irregularly irregular rhythm present.  AFIB, rate controlled  Pulmonary/Chest: Effort normal and breath sounds normal.  Respirations unlabored, no distress  Abdominal: Soft. Bowel sounds are normal.  Musculoskeletal: Normal range of motion.  2+ pitting edema BLE  Neurological: She is alert and oriented to person, place, and time.  Skin: Skin is warm and dry.  Psychiatric: She has a normal mood and affect.  Nursing note and vitals reviewed.   ED Course  Procedures (including critical care time) Labs Review Labs Reviewed  CBC - Abnormal; Notable for the following:    WBC 11.0 (*)    RBC 3.68 (*)    Hemoglobin 10.6 (*)  HCT 31.9 (*)    All other components within normal limits  BASIC METABOLIC PANEL - Abnormal; Notable for the following:    Sodium 134 (*)    Glucose, Bld 359 (*)    BUN 70 (*)    Creatinine, Ser 2.62 (*)    GFR calc non Af Amer 15 (*)    GFR calc Af Amer 18 (*)    All other components within normal limits  BRAIN NATRIURETIC PEPTIDE - Abnormal; Notable for the following:    B Natriuretic Peptide 455.4 (*)    All other components within normal limits  URINALYSIS, ROUTINE W REFLEX MICROSCOPIC - Abnormal; Notable for the following:    Glucose, UA 100 (*)    All other components within normal limits  DIGOXIN LEVEL  I-STAT TROPOININ, ED    Imaging Review Dg Chest 2 View  11/30/2014   CLINICAL DATA:  Worsening shortness of breath, leg swelling  EXAM: CHEST  2 VIEW  COMPARISON:  06/30/2012  FINDINGS: There is bilateral diffuse reticular mild interstitial prominence suspicious for interstitial edema or pneumonitis. No segmental infiltrate. Mild hyperinflation. Osteopenia and mild degenerative changes thoracic spine.  IMPRESSION: Diffuse bilateral reticular interstitial prominence suspicious for interstitial edema or pneumonitis. No segmental infiltrate. Mild hyperinflation.   Electronically Signed   By: Lahoma Crocker M.D.   On: 11/30/2014  08:21     EKG Interpretation   Date/Time:  Friday November 30 2014 07:15:28 EDT Ventricular Rate:  98 PR Interval:    QRS Duration: 103 QT Interval:  387 QTC Calculation: 494 R Axis:   175 Text Interpretation:  Atrial fibrillation RVH with secondary  repolarization abnrm Anterolateral infarct, age indeterminate Baseline  wander in lead(s) V2 V6 Confirmed by BEATON  MD, ROBERT ([id number]) on  11/30/2014 7:30:30 AM      MDM   Final diagnoses:  Acute exacerbation of CHF (congestive heart failure)  Shortness of breath  Chronic atrial fibrillation  Essential hypertension  Type 2 diabetes mellitus without complication   81 year old female with progressive shortness of breath and lower extremity edema over the past week.  She has history of CHF. She denies any chest pain. On exam, she is in no acute respiratory distress. She is in rhythm of A. fib which is chronic, rate is controlled. She does have 2+ pitting edema of her legs. Lab work as above, BNP elevated. Chest x-ray with pulmonary edema. Dose of lasix given in ED.  Feel patient would benefit from admission and IV diuresis.  Case discussed with IM teaching service resident, will admit to telemetry for further management.  Temp admission orders placed, VS remain stable.  Larene Pickett, PA-C 11/30/14 [place], MD 11/30/14 [phone]

## 2014-11-30 NOTE — Progress Notes (Addendum)
ANTICOAGULATION CONSULT NOTE - Initial Consult  Pharmacy Consult:  Coumadin / Heparin bridge Indication: atrial fibrillation  No Known Allergies  Patient Measurements: Height: 5\' 6"  (167.6 cm) Weight: 243 lb 1.6 oz (110.269 kg) IBW/kg (Calculated) : 59.3  Heparin dosing weight = 86 kg  Vital Signs: Temp: 98 F (36.7 C) (04/15 0722) Temp Source: Oral (04/15 0722) BP: 136/63 mmHg (04/15 1330) Pulse Rate: 93 (04/15 1400)  Labs:  Recent Labs  11/30/14 0745 11/30/14 1323  HGB 10.6*  --   HCT 31.9*  --   PLT 203  --   LABPROT  --  14.4  INR  --  1.11  CREATININE 2.62*  --   TROPONINI  --  <0.03    Estimated Creatinine Clearance: 19.4 mL/min (by C-G formula based on Cr of 2.62).   Medical History: Past Medical History  Diagnosis Date  . Hypertension   . Glaucoma   . Atrial fibrillation   . Hypothyroidism   . Endometrial cancer   . TIA (transient ischemic attack) 10/2011    "that's questionable" (06/29/12)  . Hypercholesterolemia   . CHF (congestive heart failure)   . Pneumonia 1980's    "couple times" (06/29/12)  . Shortness of breath     "just moving around; yesterday & today" (06/29/12)  . OSA on CPAP   . H/O hiatal hernia     "years and years ago" (06/29/12)  . GERD (gastroesophageal reflux disease)     "several years ago" (06/29/12)  . Arthritis     "in my toes" (06/29/12)  . Type II diabetes mellitus 05/1994  . Fall at home 06/24/2012    "rolled out of bed getting alarm clark that had fallen on floor" (06/29/12)     Assessment: 22 YOF with history of Afib to continue on Coumadin from PTA.  INR is sub-therapeutic on home regimen (5mg  daily except 7.5mg  on Mon and Fri).  Patient reported that she has not missed any doses PTA; however, she is unsure about last night's dose.  No bleeding reported.   8 pm -- adding heparin brige  Goal of Therapy:  INR 2-3  Heparin level = 0.3 to 0.7 Monitor platelets   Plan:  Coumadin 10mg  PO today Daily PT / INR  / heparin level / CBC Heparin 3000 units iv bolus x 1, heparin drip at 1100 units / hr  Thank you.  Thuy D. Mina Marble, PharmD, BCPS Pager:  [phone] 11/30/2014, 2:31 PM   Anette Guarneri, PharmD 11/30/14 8 pm

## 2014-12-01 DIAGNOSIS — D631 Anemia in chronic kidney disease: Secondary | ICD-10-CM

## 2014-12-01 DIAGNOSIS — E1122 Type 2 diabetes mellitus with diabetic chronic kidney disease: Secondary | ICD-10-CM

## 2014-12-01 DIAGNOSIS — I509 Heart failure, unspecified: Secondary | ICD-10-CM

## 2014-12-01 DIAGNOSIS — I130 Hypertensive heart and chronic kidney disease with heart failure and stage 1 through stage 4 chronic kidney disease, or unspecified chronic kidney disease: Secondary | ICD-10-CM

## 2014-12-01 DIAGNOSIS — Z7901 Long term (current) use of anticoagulants: Secondary | ICD-10-CM

## 2014-12-01 DIAGNOSIS — Z794 Long term (current) use of insulin: Secondary | ICD-10-CM

## 2014-12-01 LAB — COMPREHENSIVE METABOLIC PANEL
ALT: 27 U/L (ref 0–35)
AST: 14 U/L (ref 0–37)
Albumin: 3.2 g/dL — ABNORMAL LOW (ref 3.5–5.2)
Alkaline Phosphatase: 40 U/L (ref 39–117)
Anion gap: 14 (ref 5–15)
BUN: 68 mg/dL — ABNORMAL HIGH (ref 6–23)
CO2: 28 mmol/L (ref 19–32)
CREATININE: 2.44 mg/dL — AB (ref 0.50–1.10)
Calcium: 8.5 mg/dL (ref 8.4–10.5)
Chloride: 96 mmol/L (ref 96–112)
GFR calc Af Amer: 20 mL/min — ABNORMAL LOW (ref >90)
GFR, EST NON AFRICAN AMERICAN: 17 mL/min — AB (ref >90)
GLUCOSE: 109 mg/dL — AB (ref 70–99)
Potassium: 3.6 mmol/L (ref 3.5–5.1)
Sodium: 138 mmol/L (ref 135–145)
Total Bilirubin: 0.5 mg/dL (ref 0.3–1.2)
Total Protein: 6.7 g/dL (ref 6.0–8.3)

## 2014-12-01 LAB — MRSA PCR SCREENING: MRSA by PCR: NEGATIVE

## 2014-12-01 LAB — PROTIME-INR
INR: 1.16 (ref 0.00–1.49)
Prothrombin Time: 15 seconds (ref 11.6–15.2)

## 2014-12-01 LAB — CBC
HEMATOCRIT: 29.2 % — AB (ref 36.0–46.0)
Hemoglobin: 9.7 g/dL — ABNORMAL LOW (ref 12.0–15.0)
MCH: 28.7 pg (ref 26.0–34.0)
MCHC: 33.2 g/dL (ref 30.0–36.0)
MCV: 86.4 fL (ref 78.0–100.0)
Platelets: 179 10*3/uL (ref 150–400)
RBC: 3.38 MIL/uL — AB (ref 3.87–5.11)
RDW: 14.8 % (ref 11.5–15.5)
WBC: 8.9 10*3/uL (ref 4.0–10.5)

## 2014-12-01 LAB — GLUCOSE, CAPILLARY
Glucose-Capillary: 93 mg/dL (ref 70–99)
Glucose-Capillary: 177 mg/dL — ABNORMAL HIGH (ref 70–99)
Glucose-Capillary: 234 mg/dL — ABNORMAL HIGH (ref 70–99)

## 2014-12-01 LAB — LIPID PANEL
CHOL/HDL RATIO: 3.9 ratio
CHOLESTEROL: 181 mg/dL (ref 0–200)
HDL: 46 mg/dL (ref >39)
LDL CALC: 117 mg/dL — AB (ref 0–99)
Triglycerides: 89 mg/dL (ref <150)
VLDL: 18 mg/dL (ref 0–40)

## 2014-12-01 LAB — TROPONIN I

## 2014-12-01 LAB — HEPARIN LEVEL (UNFRACTIONATED)
Heparin Unfractionated: 0.2 IU/mL — ABNORMAL LOW (ref 0.30–0.70)
Heparin Unfractionated: 0.33 IU/mL (ref 0.30–0.70)

## 2014-12-01 LAB — HEMOGLOBIN A1C
Hgb A1c MFr Bld: 8.2 % — ABNORMAL HIGH (ref 4.8–5.6)
Mean Plasma Glucose: 189 mg/dL

## 2014-12-01 LAB — PHOSPHORUS: Phosphorus: 3.7 mg/dL (ref 2.3–4.6)

## 2014-12-01 LAB — HIV ANTIBODY (ROUTINE TESTING W REFLEX): HIV SCREEN 4TH GENERATION: NONREACTIVE

## 2014-12-01 LAB — TSH: TSH: 0.633 u[IU]/mL (ref 0.350–4.500)

## 2014-12-01 LAB — MAGNESIUM: MAGNESIUM: 2.3 mg/dL (ref 1.5–2.5)

## 2014-12-01 MED ORDER — ACETAMINOPHEN 500 MG PO TABS
1000.0000 mg | ORAL_TABLET | Freq: Four times a day (QID) | ORAL | Status: DC | PRN
Start: 2014-12-01 — End: 2014-12-03
  Administered 2014-12-03 (×2): 1000 mg via ORAL
  Filled 2014-12-01 (×2): qty 2

## 2014-12-01 MED ORDER — LIDOCAINE 5 % EX PTCH
1.0000 | MEDICATED_PATCH | CUTANEOUS | Status: DC
  Administered 2014-12-01 – 2014-12-03 (×3): 1 via TRANSDERMAL
  Filled 2014-12-01 (×3): qty 1

## 2014-12-01 MED ORDER — WARFARIN SODIUM 10 MG PO TABS
10.0000 mg | ORAL_TABLET | Freq: Once | ORAL | Status: AC
  Administered 2014-12-01: 10 mg via ORAL
  Filled 2014-12-01: qty 1

## 2014-12-01 NOTE — Clinical Social Work Psychosocial (Signed)
Clinical Social Work Department BRIEF PSYCHOSOCIAL ASSESSMENT 12/01/2014  Patient:  [name], [name]     Account Number:  [id number]     Admit date:  11/30/2014  Clinical Social Worker:  Glendon Axe, CLINICAL SOCIAL WORKER  Date/Time:  12/01/2014 03:35 PM  Referred by:  Physician  Date Referred:  12/01/2014 Referred for  SNF Placement   Other Referral:   Interview type:  Other - See comment Other interview type:   CSW spoke with pt's dtr, Manuela Schwartz at length via telephone.    PSYCHOSOCIAL DATA Living Status:  ALONE Admitted from facility:  N/A Level of care:  N/A Primary support name:  Oneida Alar [phone] Primary support relationship to patient:  CHILD, ADULT Degree of support available:   Strong    CURRENT CONCERNS Current Concerns  Post-Acute Placement   Other Concerns:    SOCIAL WORK ASSESSMENT / PLAN Clinical Social Worker spoke with pt's dtr, Manuela Schwartz via telephone in reference to post-acute placement for SNF. CSW introduced CSW role and SNF process.    Pt's dtr reproted pt is from home alone however she lives next door. 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 Dtr stated she visits pt everyday and does the grocery shopping and cleaning. Pt's dtr also reported pt has been a resident of [facility] in the past for broken ankle and does not believe pt will return to any SNF. Pt's dtr then asked why pt was recommended for SNF placement.    CSW explained need for SNF placement and explained pt's PT evaluation- pt's knee pain and poor balance potentially indicating pt will be at risk for falls. Pt's dtr currently not agreeable with SNF placement and reported she will discuss with pt today during visit. Pt soundly asleep and unable to wake after several attempts for assessment.    CSW informed pt's dtr that unit CSW would follow up with pt and family on Monday.    No further concerns reported by pt's dtr. CSW will continue  follow pt and pt's family for continued support and to facilitate pt's discharge needs if agreeable to SNF placement.   Assessment/plan status:  Psychosocial Support/Ongoing Assessment of Needs Other assessment/ plan:   -Placement note started.   Information/referral to community resources:   SNF information/list provided.    PATIENT'S/FAMILY'S RESPONSE TO PLAN OF CARE: CSW unable to wake pt for assessment. Pt's dtr currently not agreeable to SNF placement and would like to discuss with pt first. Pt's dtr expressed understanding of why recommendation was made. Pt's dtr pleasant, supportive of pt's care and appreciated social work intervention.    Glendon Axe, MSW, LCSWA [phone] 12/01/2014 3:50 PM

## 2014-12-01 NOTE — Progress Notes (Signed)
Utilization Review Completed.[name] T4/16/2016  

## 2014-12-01 NOTE — Progress Notes (Signed)
MD on call notified of 325 CBG at bedtime. This RN requested bedtime novolog coverage. New orders received. Will continue to monitor patient closely.

## 2014-12-01 NOTE — Progress Notes (Signed)
  Echocardiogram 2D Echocardiogram has been performed.  [name] 12/01/2014, 10:48 AM

## 2014-12-01 NOTE — Clinical Social Work Placement (Signed)
Clinical Social Work Department CLINICAL SOCIAL WORK PLACEMENT NOTE 12/01/2014  Patient:  [name], [name]  Account Number:  [id number] [place] date:  11/30/2014  Clinical Social Worker:  Glendon Axe, CLINICAL SOCIAL WORKER  Date/time:  12/01/2014 03:53 PM  Clinical Social Work is seeking post-discharge placement for this patient at the following level of care:   SKILLED NURSING   (*CSW will update this form in Epic as items are completed)   12/01/2014  Patient/family provided with [facility] Department of Clinical Social Work's list of facilities offering this level of care within the geographic area requested by the patient (or if unable, by the patient's family).  12/01/2014  Patient/family informed of their freedom to choose among providers that offer the needed level of care, that participate in Medicare, Medicaid or managed care program needed by the patient, have an available bed and are willing to accept the patient.  12/01/2014  Patient/family informed of [facility]' ownership interest in [facility], as well as of the fact that they are under no obligation to receive care at this facility.  PASARR submitted to EDS on  PASARR number received on   FL2 transmitted to all facilities in geographic area requested by pt/family on   FL2 transmitted to all facilities within larger geographic area on   Patient informed that his/her managed care company has contracts with or will negotiate with  certain facilities, including the following:     Patient/family informed of bed offers received:   Patient chooses bed at  Physician recommends and patient chooses bed at    Patient to be transferred to  on   Patient to be transferred to facility by  Patient and family notified of transfer on  Name of family member notified:    The following physician request were entered in Epic:   Additional Comments:   Glendon Axe, MSW, LCSWA [phone] 12/01/2014 3:54 PM

## 2014-12-01 NOTE — Progress Notes (Addendum)
ANTICOAGULATION CONSULT NOTE - Follow Up Consult  Pharmacy Consult for Heparin and Coumadin Indication: atrial fibrillation  No Known Allergies  Patient Measurements: Height: 5\' 6"  (167.6 cm) Weight: 243 lb 6.4 oz (110.406 kg) IBW/kg (Calculated) : 59.3  Heparin dosing weight: 86 kg  Vital Signs: Temp: 97.8 F (36.6 C) (04/16 0544) Temp Source: Oral (04/16 0544) BP: 112/55 mmHg (04/16 0544) Pulse Rate: 79 (04/16 0544)  Labs:  Recent Labs  11/30/14 0745 11/30/14 1323 11/30/14 1650 11/30/14 2338 12/01/14 0630  HGB 10.6*  --   --   --  9.7*  HCT 31.9*  --   --   --  29.2*  PLT 203  --   --   --  179  LABPROT  --  14.4  --   --  15.0  INR  --  1.11  --   --  1.16  HEPARINUNFRC  --   --   --   --  0.20*  CREATININE 2.62*  --   --   --   --   TROPONINI  --  <0.03 <0.03 <0.03  --    Estimated Creatinine Clearance: 19.4 mL/min (by C-G formula based on Cr of 2.62).  Assessment: Sub-therapeutic heparin level, no issues per RN.   Goal of Therapy:  Heparin level 0.3-0.7 units/ml Monitor platelets by anticoagulation protocol: Yes   Plan:  -Increase heparin drip to 1300 units/hr -1600 HL -Daily CBC/HL -Monitor for bleeding  Narda Bonds 12/01/2014,7:56 AM  ADDENDUM: 38 YOF admitted on 11/30/2014 with SOB. Pharmacy consulted to dose heparin with Coumadin for PAF (CHADS2VASc 8).INR on admit was SUBtherapeutic at 1.11. INR today remains SUBtherapeutic at 1.16. H/H trend down, plt wnl and no reported s/s bleeding.  Home dose: 5mg  daily except 7.5mg  on Mon/Fri, last dose ?4/14 (Patient reported that she has not missed any doses PTA; however, she is unsure about 4/14 dose)   Plan: - Coumadin 10 mg PO x 1 tonight - Daily INR  Melanee Cordial K. Velva Harman, PharmD, [place] Clinical Pharmacist - Resident Pager: [phone] Pharmacy: [phone] 12/01/2014 2:58 PM

## 2014-12-01 NOTE — Evaluation (Signed)
Physical Therapy Evaluation Patient Details Name: [name] MRN: [id number] DOB: 1925-12-28 Today's Date: 12/01/2014   History of Present Illness  81 yo female with onset of SOB and CHF exacerbation is also having undiagnosed cause L knee pain  Clinical Impression  Pt is demonstrating some instability that makes her a high fall risk, with need for help to stand and maneuver that she will not have at home. Has some acute L knee pain adding to the issues and would recommend a SNF stay to resolve the issues as she might be at risk for injury if a fall occurs.      Follow Up Recommendations SNF;Supervision/Assistance - 24 hour    Equipment Recommendations  Rolling walker with 5" wheels    Recommendations for Other Services       Precautions / Restrictions Precautions Precautions: Fall Restrictions Weight Bearing Restrictions: No      Mobility  Bed Mobility               General bed mobility comments: up when PT arrived  Transfers Overall transfer level: Needs assistance Equipment used: Rolling walker (2 wheeled);1 person hand held assist Transfers: Sit to/from [facility] Sit to Stand: Mod assist Stand pivot transfers: Min assist       General transfer comment: reminders for hand placement  Ambulation/Gait Ambulation/Gait assistance: Modified independent (Device/Increase time) Ambulation Distance (Feet): 50 Feet Assistive device: Rolling walker (2 wheeled);1 person hand held assist Gait Pattern/deviations: Step-to pattern;Decreased stride length;Shuffle;Wide base of support Gait velocity: slowed Gait velocity interpretation: Below normal speed for age/gender General Gait Details: Pt has some difficulty with maneuvering walker, mainly through narrow spaces but handled with vc's  Stairs            Wheelchair Mobility    Modified Rankin (Stroke Patients Only)       Balance Overall balance assessment: Needs  assistance Sitting-balance support: Feet supported Sitting balance-Leahy Scale: Good   Postural control: Posterior lean Standing balance support: Bilateral upper extremity supported Standing balance-Leahy Scale: Poor                               Pertinent Vitals/Pain Pain Assessment: 0-10 Pain Score: 7  Pain Location: L knee Pain Intervention(s): Limited activity within patient's tolerance;Monitored during session;Premedicated before session;Repositioned    Home Living Family/patient expects to be discharged to:: Private residence Living Arrangements: Alone Available Help at Discharge: Family;Friend(s) Type of Home: House Home Access: Stairs to enter Entrance Stairs-Rails: None Entrance Stairs-Number of Steps: 1 Home Layout: One level Home Equipment: Cane - single point      Prior Function Level of Independence: Independent with assistive device(s)         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  Has limited help for home and will need to go to inpt therapy to recover her control of mobility    Exercises        Assessment/Plan    PT Assessment Patient needs continued PT services  PT Diagnosis Difficulty walking;Acute pain   PT Problem List Decreased strength;Decreased range of motion;Decreased activity tolerance;Decreased  balance;Decreased mobility;Decreased coordination;Decreased knowledge of use of DME;Pain  PT Treatment Interventions DME instruction;Gait training;Functional mobility training;Therapeutic activities;Therapeutic exercise;Balance training;Neuromuscular re-education;Patient/family education   PT Goals (Current goals can be found in the Care Plan section) Acute Rehab PT Goals Patient Stated Goal: to get home with independence and back to work PT Goal Formulation: With patient Time For Goal Achievement: 12/15/14 Potential to Achieve Goals: Good    Frequency Min 3X/week   Barriers to discharge Decreased caregiver support No regular home help    Co-evaluation               End of Session Equipment Utilized During Treatment: Gait belt;Oxygen Activity Tolerance: Patient tolerated treatment well;Patient limited by lethargy;Patient limited by fatigue Patient left: in chair;with call bell/phone within reach;with nursing/sitter in room Nurse Communication: Mobility status         Time: 7290-2111 PT Time Calculation (min) (ACUTE ONLY): 26 min   Charges:   PT Evaluation $Initial PT Evaluation Tier I: 1 Procedure PT Treatments $Gait Training: 8-22 mins   PT G Codes:        Ramond Dial 12/26/14, 12:56 PM   Mee Hives, PT MS Acute Rehab Dept. Number: [phone]

## 2014-12-01 NOTE — Consult Note (Signed)
CARDIOLOGY CONSULT NOTE  Patient ID: [name] MRN: [id number] DOB/AGE: Oct 15, 1925 81 y.o.  Admit date: 11/30/2014 Referring Physician:  [facility] Primary Physician:  Tamsen Roers, MD Reason for Consultation:  Acute exacerbation of CHF  HPI:  [name] is a 81 year old female with long-standing history of chronic atrial fibrillation on Xarelto for anticoagulation, chronic diastolic heart failure, DM, obesity, hypertension and hyperlipidemia.  She was last seen on an outpatient basis in November 2015. She presented to the ED on 11/30/2014 with worsening SOB and lower extremity edema for 1 week.  She denied any chest pain, PND, or orthopnea.  She has a history of noncompliance with low sodium diet and admits to this. No chest pain or palpitations. Coumadin followed by her PCP.  Unable to tolerate spironolactone in the past due to hyperkalemia.   Weight up approximately 35 lbs since last office visit.  BNP elevated at 455.  Echocardiogram this morning reveals normal EF at 55-60% and mild pulmonary hypertension which is unchanged from outpatient echo in January 2015.    Past Medical History  Diagnosis Date  . Hypertension   . Glaucoma   . Atrial fibrillation   . Hypothyroidism   . Endometrial cancer   . TIA (transient ischemic attack) 10/2011    "that's questionable" (06/29/12)  . Hypercholesterolemia   . CHF (congestive heart failure)   . Pneumonia 1980's    "couple times" (06/29/12)  . Shortness of breath     "just moving around; yesterday & today" (06/29/12)  . OSA on CPAP   . H/O hiatal hernia     "years and years ago" (06/29/12)  . GERD (gastroesophageal reflux disease)     "several years ago" (06/29/12)  . Arthritis     "in my toes" (06/29/12)  . Type II diabetes mellitus 05/1994  . Fall at home 06/24/2012    "rolled out of bed getting alarm clark that had fallen on floor" (06/29/12)     Past Surgical History  Procedure Laterality Date  . Ankle  fracture surgery  10/2007    right  . Abdominal hysterectomy  1996?  Marland Kitchen Cataract extraction w/ intraocular lens  implant, bilateral  2005?     History reviewed. No pertinent family history.   Social History: History   Social History  . Marital Status: Divorced    Spouse Name: N/A  . Number of Children: N/A  . Years of Education: N/A   Occupational History  . Not on file.   Social History Main Topics  . Smoking status: Never Smoker   . Smokeless tobacco: Never Used  . Alcohol Use: No  . Drug Use: No  . Sexual Activity: Not Currently   Other Topics Concern  . Not on file   Social History Narrative     Prescriptions prior to admission  Medication Sig Dispense Refill Last Dose  . acetaminophen (TYLENOL) 325 MG tablet Take 650 mg by mouth every 6 (six) hours as needed for mild pain.   11/29/2014 at Unknown time  . acetaminophen (TYLENOL) 500 MG tablet Take 500-1,000 mg by mouth every 6 (six) hours as needed for moderate pain.   11/29/2014 at Unknown time  . amLODipine (NORVASC) 10 MG tablet Take 1 tablet (10 mg total) by mouth daily. 30 tablet 0 11/30/2014 at Unknown time  . aspirin 81 MG tablet Take 81 mg by mouth daily.   11/30/2014 at Unknown time  . bimatoprost (LUMIGAN) 0.01 % SOLN Place 1 drop into both eyes at bedtime.  11/29/2014 at Unknown time  . brimonidine (ALPHAGAN P) 0.1 % SOLN Place 1 drop into both eyes every 12 (twelve) hours.   11/29/2014 at Unknown time  . bumetanide (BUMEX) 2 MG tablet Take 2 tablets (4 mg total) by mouth 2 (two) times daily. 60 tablet 0 11/29/2014 at Unknown time  . chlorthalidone (HYGROTON) 25 MG tablet Take 25 mg by mouth daily.  0 11/29/2014 at Unknown time  . cycloSPORINE (RESTASIS) 0.05 % ophthalmic emulsion Place 1 drop into both eyes every 12 (twelve) hours.   11/29/2014 at Unknown time  . insulin glargine (LANTUS SOLOSTAR) 100 UNIT/ML injection Inject 55 Units into the skin daily. (Patient taking differently: Inject 35 Units into the skin  daily. ) 5 pen 0 11/30/2014 at Unknown time  . levothyroxine (SYNTHROID, LEVOTHROID) 50 MCG tablet Take 50 mcg by mouth daily.   11/29/2014 at Unknown time  . metoprolol succinate (TOPROL-XL) 50 MG 24 hr tablet Take 50 mg by mouth daily. Take with or immediately following a meal.   11/29/2014 at 0800  . Multiple Vitamins-Minerals (CENTRUM SILVER PO) Take 1 tablet by mouth daily.   11/29/2014 at Unknown time  . Polyethyl Glycol-Propyl Glycol (SYSTANE OP) Place 1-4 drops into both eyes 4 (four) times daily as needed. For dry eyes   Past Week at Unknown time  . predniSONE (DELTASONE) 5 MG tablet   0 11/30/2014 at Unknown time  . warfarin (COUMADIN) 5 MG tablet Take 5 mg by mouth daily. 7.5 mg every day except for Monday and Friday when you take 5 mg daily   11/29/2014 at Unknown time  . insulin aspart (NOVOLOG FLEXPEN) 100 UNIT/ML injection Inject 15 Units into the skin 3 (three) times daily with meals. (Patient not taking: Reported on 11/30/2014) 5 pen 0 Not Taking at Unknown time    Scheduled Meds: . amLODipine  10 mg Oral Daily  . brimonidine  1 drop Both Eyes Q12H  . furosemide  40 mg Intravenous BID  . insulin aspart  0-5 Units Subcutaneous QHS  . insulin aspart  0-9 Units Subcutaneous TID WC  . insulin glargine  40 Units Subcutaneous QHS  . latanoprost  1 drop Both Eyes QHS  . levothyroxine  50 mcg Oral QAC breakfast  . lidocaine  1 patch Transdermal Q24H  . metoprolol succinate  50 mg Oral Daily  . sodium chloride  3 mL Intravenous Q12H  . warfarin  10 mg Oral ONCE-1800  . Warfarin - Pharmacist Dosing Inpatient   Does not apply q1800   Continuous Infusions: . heparin 1,300 Units/hr (12/01/14 0827)   PRN Meds:.sodium chloride, acetaminophen, ondansetron (ZOFRAN) IV, sodium chloride  ROS: General: no fevers/chills/night sweats Eyes: no blurry vision, diplopia, or amaurosis ENT: no sore throat or hearing loss Resp: reports SOB; no cough, wheezing, or hemoptysis CV: reports lower  extremity edema; no chest pain, PND, or orthopnea GI: no abdominal pain, nausea, vomiting, diarrhea, or constipation GU: no dysuria, frequency, or hematuria Skin: no rash Neuro: no headache, numbness, tingling, or weakness of extremities Musculoskeletal: no joint pain or swelling Heme: no bleeding, DVT, or easy bruising Endo: no polydipsia or polyuria    Physical Exam: Blood pressure 138/68, pulse 82, temperature 97.8 F (36.6 C), temperature source Oral, resp. rate 16, height 5\' 6"  (1.676 m), weight 110.406 kg (243 lb 6.4 oz), SpO2 98 %.   Filed Weights   11/30/14 0735 12/01/14 0544  Weight: 110.269 kg (243 lb 1.6 oz) 110.406 kg (243 lb 6.4 oz)  Body mass index is 39.3 kg/(m^2).  General appearance: alert, cooperative, appears stated age, no distress and moderately obese Neck: no adenopathy, no carotid bruit, supple, symmetrical, trachea midline, thyroid not enlarged, symmetric, no tenderness/mass/nodules and JVD present upto the angle of jaw. Lungs: clear to auscultation bilaterally Heart: S1 is variable, S2 is normal.  2/6 systolic ejection murmur  at the apex and right sternal border,  distant heart sounds.  No gallop or rub appreciated. Abdomen: soft, non-tender; bowel sounds normal; no masses,  no organomegaly and Obese and mild tenderness present. Extremities: edema 2+.  There is no calf tenderness. Pulses: 2+ and symmetric Neurologic: Grossly normal  Labs:   Lab Results  Component Value Date   WBC 8.9 12/01/2014   HGB 9.7* 12/01/2014   HCT 29.2* 12/01/2014   MCV 86.4 12/01/2014   PLT 179 12/01/2014    Recent Labs Lab 12/01/14 0630  NA 138  K 3.6  CL 96  CO2 28  BUN 68*  CREATININE 2.44*  CALCIUM 8.5  PROT 6.7  BILITOT 0.5  ALKPHOS 40  ALT 27  AST 14  GLUCOSE 109*   Lab Results  Component Value Date   CKTOTAL 484* 10/19/2007   CKMB 1.6 10/19/2007   TROPONINI <0.03 11/30/2014    Lipid Panel     Component Value Date/Time   CHOL 181 12/01/2014  0630   TRIG 89 12/01/2014 0630   HDL 46 12/01/2014 0630   CHOLHDL 3.9 12/01/2014 0630   VLDL 18 12/01/2014 0630   LDLCALC 117* 12/01/2014 0630   EKG 12/01/2014: Atrial fibrillation with controlled ventricular response, normal axis, poor R-wave progression, cannot exclude anterior infarct old.  Nonspecific T-wave flattening.  No definite evidence of ischemia.  No significant change compared to prior EKG performed on 11/30/2014, heart rate better controlled and PVCs no longer present.  Echocardiogram 12/01/2014:  - Left ventricle: The cavity size was normal. Wall thickness was normal. Systolic function was normal. The estimated ejection fraction was in the range of 55% to 60%. Wall motion was normal; there were no regional wall motion abnormalities. - Aortic valve: Valve mobility was restricted. There was moderate stenosis. There was trivial regurgitation. Valve area (VTI): 1.53cm^2. Valve area (Vmax): 1.3 cm^2. Valve area (Vmean): 1.24 cm^2. - Mitral valve: Calcified annulus. Mildly thickened leaflets. There was mild regurgitation. - Left atrium: The atrium was moderately to severely dilated. - Pulmonary arteries: Systolic pressure was mildly increased. PA peak pressure: 31 mm Hg (S).   Radiology: Dg Chest 2 View  11/30/2014   CLINICAL DATA:  Worsening shortness of breath, leg swelling  EXAM: CHEST  2 VIEW  COMPARISON:  06/30/2012  FINDINGS: There is bilateral diffuse reticular mild interstitial prominence suspicious for interstitial edema or pneumonitis. No segmental infiltrate. Mild hyperinflation. Osteopenia and mild degenerative changes thoracic spine.  IMPRESSION: Diffuse bilateral reticular interstitial prominence suspicious for interstitial edema or pneumonitis. No segmental infiltrate. Mild hyperinflation.   Electronically Signed   By: Lahoma Crocker M.D.   On: 11/30/2014 08:21   Cardiac Panel (last 3 results)  Recent Labs  11/30/14 1323 11/30/14 1650 11/30/14 2338  TROPONINI <0.03 <0.03  <0.03      ASSESSMENT AND PLAN:  1. Acute on chronic diastolic heart failure 2. CKD Stage IV due to DM 3. Chronic A. Fib. CHA2DS2-VASCScore: Risk Score  5,  Yearly risk of stroke  6.7. Recommendation: ASA No/Anticoagulation Yes  [place] Has Bled: Score 2.  Estimated risk of major bleeding at 1 year with [place] 1.88to  3.2 4. Hypertension 5. Hyperlipidemia 6. Diabetes II uncontrolled. 7. Mild aortic stenosis. 8.  Chronic anemia of chronic disease,  Stable.  Suspect renal insufficiency probably contributing.  Recommendation: Patient doesn't patient is due to her noncompliance with dietary restrictions.  However renal insufficiency, results of aortic stenosis and obesity is contributing to her presentation also.  At this point hasn't responded well to simple therapy with IV diuretics, continue the same and transition her to by mouth Lasix probably Monday.  She still has significant fluid gain.  Unable to give her any ACE inhibitor's or ARB due to stage IV chronic kidney disease and development of hyperkalemia in the outpatient basis.  Continue beta blocker.  Heart rate is well controlled, hence no changes with regard to cardiac status is indicated at this time, I have also discussed the patient regarding transitioning to Pradaxa 75 mg twice a day versus Coumadin due to subtherapeutic INR.  I will see her back in about patient basis.  Adrian Prows, MD 12/01/2014, 3:15 PM [facility] Pager: [phone] Office: [phone] If no answer Cell [phone]

## 2014-12-01 NOTE — Progress Notes (Signed)
  PROGRESS NOTE MEDICINE TEACHING ATTENDING   Day 1 of stay Patient name: [name]   Medical record number: [id number] Date of birth: [birth year]   Met with patient in the morning. She reports improvement in breathing.  Blood pressure 138/68, pulse 82, temperature 97.8 F (36.6 C), temperature source Oral, resp. rate 16, height $RemoveBe'5\' 6"'HzGIwIKlY$  (1.676 m), weight 243 lb 6.4 oz (110.406 kg), SpO2 98 %. Her lung exam is clear to auscultation. Heart - regular rhythm, no murmur. She has trace pedal edema. Peripheral pulses palpable.  I have reviewed the chart, lab results, EKG, imaging and relevant notes of this patient.    CHF exacerbation - Clinically improving. Continue lasix IV today and convert to PO tomorrow if the patient keeps improving. Appreciate cardiology consult. New Q waves this admission prompted [place] to get a 2d echo which does not show any regional wall motion abnormalities.   I have discussed the care of this patient with my IM team residents. Please see the resident note for details.  [place], [place] 12/01/2014, 2:01 PM.

## 2014-12-01 NOTE — Progress Notes (Addendum)
Subjective: She reports she is feeling better today. Voiding without difficulty. Denies chest pain. Her breathing has improved. Denies nausea or vomiting and tolerating PO intake.  Objective: Vital signs in last 24 hours: Filed Vitals:   11/30/14 2000 12/01/14 0544 12/01/14 0826 12/01/14 1100  BP: 136/63 112/55 158/67 138/68  Pulse: 84 79 80 82  Temp: 98.7 F (37.1 C) 97.8 F (36.6 C)    TempSrc: Oral Oral    Resp: 14 16    Height:      Weight:  243 lb 6.4 oz (110.406 kg)    SpO2: 94% 90% 98%    Weight change:   Intake/Output Summary (Last 24 hours) at 12/01/14 1311 Last data filed at 12/01/14 1200  Gross per 24 hour  Intake    549 ml  Output   2950 ml  Net  -2401 ml   General Apperance: NAD HEENT: Normocephalic, atraumatic, PERRL, EOMI, anicteric sclera Neck: Supple, trachea midline Lungs: Clear to auscultation bilaterally. No wheezes, rhonchi or rales. Breathing comfortably on RA Heart: Irregular rate and rhythm, no murmur/rub/gallop Abdomen: Soft, nontender, nondistended, no rebound/guarding Extremities: Normal, atraumatic, warm and well perfused, 2+ bilateral LE edema Pulses: 2+ throughout Skin: No rashes or lesions Neurologic: Alert and oriented x 3. Speech fluent without aphasia. CNII-XII intact. Normal strength and sensation bilaterally. DTR 1+ and symmetric.  Lab Results: Basic Metabolic Panel:  Recent Labs Lab 11/30/14 0745 12/01/14 0630  NA 134* 138  K 4.4 3.6  CL 96 96  CO2 23 28  GLUCOSE 359* 109*  BUN 70* 68*  CREATININE 2.62* 2.44*  CALCIUM 8.7 8.5  MG  --  2.3  PHOS  --  3.7   Liver Function Tests:  Recent Labs Lab 12/01/14 0630  AST 14  ALT 27  ALKPHOS 40  BILITOT 0.5  PROT 6.7  ALBUMIN 3.2*   CBC:  Recent Labs Lab 11/30/14 0745 12/01/14 0630  WBC 11.0* 8.9  HGB 10.6* 9.7*  HCT 31.9* 29.2*  MCV 86.7 86.4  PLT 203 179   Cardiac Enzymes:  Recent Labs Lab 11/30/14 1323 11/30/14 1650 11/30/14 2338  TROPONINI <0.03  <0.03 <0.03   CBG:  Recent Labs Lab 11/30/14 1245 11/30/14 1608 11/30/14 2150 12/01/14 0746 12/01/14 1159  GLUCAP 239* 299* 325* 93 177*   Hemoglobin A1C:  Recent Labs Lab 11/30/14 1323  HGBA1C 8.2*   Fasting Lipid Panel:  Recent Labs Lab 12/01/14 0630  CHOL 181  HDL 46  LDLCALC 117*  TRIG 89  CHOLHDL 3.9   Thyroid Function Tests:  Recent Labs Lab 12/01/14 0500  TSH 0.633   Coagulation:  Recent Labs Lab 11/30/14 1323 12/01/14 0630  LABPROT 14.4 15.0  INR 1.11 1.16   Anemia Panel:  Recent Labs Lab 11/30/14 1323  VITAMINB12 1141*  FOLATE >20.0  FERRITIN 42  TIBC 310  IRON 38*  RETICCTPCT 2.5   Urinalysis:  Recent Labs Lab 11/30/14 0934  COLORURINE YELLOW  LABSPEC 1.009  PHURINE 5.5  GLUCOSEU 100*  HGBUR NEGATIVE  BILIRUBINUR NEGATIVE  KETONESUR NEGATIVE  PROTEINUR NEGATIVE  UROBILINOGEN 0.2  NITRITE NEGATIVE  LEUKOCYTESUR NEGATIVE   Studies/Results: Dg Chest 2 View  11/30/2014   CLINICAL DATA:  Worsening shortness of breath, leg swelling  EXAM: CHEST  2 VIEW  COMPARISON:  06/30/2012  FINDINGS: There is bilateral diffuse reticular mild interstitial prominence suspicious for interstitial edema or pneumonitis. No segmental infiltrate. Mild hyperinflation. Osteopenia and mild degenerative changes thoracic spine.  IMPRESSION: Diffuse bilateral reticular interstitial  prominence suspicious for interstitial edema or pneumonitis. No segmental infiltrate. Mild hyperinflation.   Electronically Signed   By: Lahoma Crocker M.D.   On: 11/30/2014 08:21   Medications: I have reviewed the patient's current medications. Scheduled Meds: . amLODipine  10 mg Oral Daily  . brimonidine  1 drop Both Eyes Q12H  . furosemide  40 mg Intravenous BID  . insulin aspart  0-5 Units Subcutaneous QHS  . insulin aspart  0-9 Units Subcutaneous TID WC  . insulin glargine  40 Units Subcutaneous QHS  . latanoprost  1 drop Both Eyes QHS  . levothyroxine  50 mcg Oral QAC  breakfast  . lidocaine  1 patch Transdermal Q24H  . metoprolol succinate  50 mg Oral Daily  . sodium chloride  3 mL Intravenous Q12H  . Warfarin - Pharmacist Dosing Inpatient   Does not apply q1800   Continuous Infusions: . heparin 1,300 Units/hr (12/01/14 0827)   PRN Meds:.sodium chloride, acetaminophen, ondansetron (ZOFRAN) IV, sodium chloride Assessment/Plan: Principal Problem:   Acute exacerbation of CHF (congestive heart failure) Active Problems:   HTN (hypertension)   Hypothyroidism   DM2 (diabetes mellitus, type 2)   Atrial fibrillation   CKD (chronic kidney disease), stage III  Acute on chronic dCHF: She's been compliant with her oral diuretic therapy including Bumex 4mg  bid and Chlorthalidone 25 mg daily. EKG on admission with new Q waves. On repeat in evening, no Q waves. Troponins negative x 3. Last echocardiogram 11/29/2011 revealed EF of 65, 70% but with increased ventricular filling pressures,?diastolic dysfunction. Chest x-ray reveals interstitial edema. Discharge wt 225 in 2013, now weights 243lb. She lost 14 pounds with diuresis during her last admission in 2013. Est dry wt is around 225lb. Patient sees Dr Einar Gip of cardiology. -[place] home Bumex. -IV Lasix 40 mg twice a day -Monitor electrolytes and renal function, in and out, daily wts -Cardiology consulted -Repeat EKG pending -Echo pending  Chronic Atrial fibrillation: Rate controlled with Metoprolol. On Coumadin. She takes 7.5 mg daily except on Mon and Fri when she takes 5 mg daily. -Continue with Coumadin per pharmacy. -Check INR.  -Continue with Metoprolol XL 50 mg daily -Heparin gtt to bridge  Hypertension: BP 150/86 on admission. Takes Amlodipine 10 mg daily, Metoprolol 50 mg daily, Bumex 4 mg twice a day and Chlorthalidone 25 mg daily. -Continue with Metoprolol XL 50 mg daily -Amlodipine 10 mg daily  DM type II: Last A1c in EMR 8.7% on 06/30/2012. She takes Lantus 35 units every morning. Has  a prescription of NovoLog 3 times a day that she has not used it in the last several weeks. No hypoglycemia or hyperglycemia episodes. However, her blood sugar was above 300 in the ED. She is currently on Prednisone for the last 1 week. -Lantus 40 units every morning -SSI -CBG monitoring  CKD stage 3-4: Baseline creatinine around 1.8-1.9 based on results from 3 years ago. Cr 2.62 with BUN 70 (BL~38) today. No other electrolyte abnormalities. Cr bump likely due to acute CHF exacerbation. This could be progressive as well but this is difficult to determine without recent labs. U/A is normal. Cr decreased to 2.44 from 2.62. -Obtain results from her PCP regarding recent renal function. -Will continue with diuresis and see if her renal function improves.  -Consider further work up for AKI if she does not improve.  Anemia secondary to CKD: Hemoglobin 10.6. Baseline 8-9. likely due to chronic renal disease. Low Iron, normal TIBC and ferritin consistent with anemia of  chronic disease -Continue to monitor  Hypothyroidism: continue with Levothyroxine   FEN: Low salt with fluid restriction  VTE ppx: Continue coumadin. Heparin gtt   Dispo: Disposition is deferred at this time, awaiting improvement of current medical problems.  Anticipated discharge in approximately 1-2 day(s).   The patient does have a current PCP Tamsen Roers, MD) and does not need an [facility] hospital follow-up appointment after discharge.  The patient does not have transportation limitations that hinder transportation to clinic appointments.  .Services Needed at time of discharge: Y = Yes, Blank = No PT: SNF vs 24hr supervision/assist  OT:   RN:   Equipment: Rolling walker with 5" wheels  Other:     LOS: 1 day   Milagros Loll, MD 12/01/2014, 1:11 PM

## 2014-12-02 LAB — CBC
HCT: 33.2 % — ABNORMAL LOW (ref 36.0–46.0)
HEMOGLOBIN: 11.2 g/dL — AB (ref 12.0–15.0)
MCH: 28.5 pg (ref 26.0–34.0)
MCHC: 33.7 g/dL (ref 30.0–36.0)
MCV: 84.5 fL (ref 78.0–100.0)
PLATELETS: 201 10*3/uL (ref 150–400)
RBC: 3.93 MIL/uL (ref 3.87–5.11)
RDW: 14.6 % (ref 11.5–15.5)
WBC: 8.1 10*3/uL (ref 4.0–10.5)

## 2014-12-02 LAB — BASIC METABOLIC PANEL
Anion gap: 14 (ref 5–15)
BUN: 74 mg/dL — ABNORMAL HIGH (ref 6–23)
CO2: 26 mmol/L (ref 19–32)
Calcium: 8.1 mg/dL — ABNORMAL LOW (ref 8.4–10.5)
Chloride: 95 mmol/L — ABNORMAL LOW (ref 96–112)
Creatinine, Ser: 2.56 mg/dL — ABNORMAL HIGH (ref 0.50–1.10)
GFR calc non Af Amer: 16 mL/min — ABNORMAL LOW (ref >90)
GFR, EST AFRICAN AMERICAN: 18 mL/min — AB (ref >90)
Glucose, Bld: 147 mg/dL — ABNORMAL HIGH (ref 70–99)
Potassium: 3.7 mmol/L (ref 3.5–5.1)
SODIUM: 135 mmol/L (ref 135–145)

## 2014-12-02 LAB — PROTIME-INR
INR: 1.23 (ref 0.00–1.49)
Prothrombin Time: 15.7 seconds — ABNORMAL HIGH (ref 11.6–15.2)

## 2014-12-02 LAB — ALBUMIN: Albumin: 3.2 g/dL — ABNORMAL LOW (ref 3.5–5.2)

## 2014-12-02 LAB — GLUCOSE, CAPILLARY
Glucose-Capillary: 191 mg/dL — ABNORMAL HIGH (ref 70–99)
Glucose-Capillary: 112 mg/dL — ABNORMAL HIGH (ref 70–99)
Glucose-Capillary: 157 mg/dL — ABNORMAL HIGH (ref 70–99)
Glucose-Capillary: 192 mg/dL — ABNORMAL HIGH (ref 70–99)
Glucose-Capillary: 196 mg/dL — ABNORMAL HIGH (ref 70–99)

## 2014-12-02 LAB — HEPARIN LEVEL (UNFRACTIONATED): HEPARIN UNFRACTIONATED: 0.47 [IU]/mL (ref 0.30–0.70)

## 2014-12-02 LAB — PHOSPHORUS: Phosphorus: 4.3 mg/dL (ref 2.3–4.6)

## 2014-12-02 MED ORDER — FUROSEMIDE 80 MG PO TABS
80.0000 mg | ORAL_TABLET | Freq: Two times a day (BID) | ORAL | Status: DC
  Administered 2014-12-02 – 2014-12-03 (×2): 80 mg via ORAL
  Filled 2014-12-02 (×2): qty 1

## 2014-12-02 MED ORDER — ATORVASTATIN CALCIUM 40 MG PO TABS
40.0000 mg | ORAL_TABLET | Freq: Every day | ORAL | Status: DC
  Filled 2014-12-02: qty 1

## 2014-12-02 MED ORDER — WARFARIN SODIUM 7.5 MG PO TABS
7.5000 mg | ORAL_TABLET | Freq: Once | ORAL | Status: AC
  Administered 2014-12-02: 7.5 mg via ORAL
  Filled 2014-12-02: qty 1

## 2014-12-02 NOTE — Progress Notes (Addendum)
ANTICOAGULATION CONSULT NOTE - Follow Up Consult  Pharmacy Consult for Heparin and Coumadin Indication: atrial fibrillation  No Known Allergies  Patient Measurements: Height: 5\' 6"  (167.6 cm) Weight: 234 lb (106.142 kg) IBW/kg (Calculated) : 59.3  Heparin dosing weight: 86 kg  Vital Signs: Temp: 98.3 F (36.8 C) (04/17 0500) BP: 125/56 mmHg (04/17 0500) Pulse Rate: 82 (04/17 0500)  Labs:  Recent Labs  11/30/14 0745 11/30/14 1323 11/30/14 1650 11/30/14 2338 12/01/14 0630 12/01/14 1555 12/02/14 0425  HGB 10.6*  --   --   --  9.7*  --  11.2*  HCT 31.9*  --   --   --  29.2*  --  33.2*  PLT 203  --   --   --  179  --  201  LABPROT  --  14.4  --   --  15.0  --  15.7*  INR  --  1.11  --   --  1.16  --  1.23  HEPARINUNFRC  --   --   --   --  0.20* 0.33 0.47  CREATININE 2.62*  --   --   --  2.44*  --  2.56*  TROPONINI  --  <0.03 <0.03 <0.03  --   --   --    Estimated Creatinine Clearance: 19.4 mL/min (by C-G formula based on Cr of 2.56).  Assessment: 10 YOF admitted on 11/30/2014 with SOB. Pharmacy consulted to dose heparin with Coumadin for PAF (CHADS2VASc 8). INR on admit was SUBtherapeutic at 1.11. HL remains therapeutic at 0.47. INR today remains SUBtherapeutic but has started to trend up to 1.23 after 2 days of increased doses. H/H remains low but improved, plt wnl and no reported s/s bleeding.  Home dose: 7.5 mg daily except 5mg  on Mon/Fri, last dose ?4/14 (Patient reported that she has not missed any doses PTA; however, she is unsure about 4/14 dose)  Goal of Therapy:  Heparin level 0.3-0.7 units/ml Monitor platelets by anticoagulation protocol: Yes   Plan:  - Continue heparin drip at 1300 units/hr - Coumadin 7.5 mg PO x 1 tonight (in line with home dose since has received 2 days of increased doses) - Daily HL/INR/CBC - Monitor for bleeding  Keiaira Donlan K. Velva Harman, PharmD, [place] Clinical Pharmacist - Resident Pager: [phone] Pharmacy: [phone] 12/02/2014  8:23 AM

## 2014-12-02 NOTE — Clinical Social Work Note (Signed)
CSW met with patient to check in and discuss dc plans-  She reports plans for dc to home-  "I work and want to get back to my home and job". Patient has been seen by PT and they are recommending SNF- patient reports she has been up walking and feels comfortable going home.  Weekday CSW to be updated and to follow up for further direction/planning.   Eduard Clos, MSW, [place] [phone]

## 2014-12-02 NOTE — Progress Notes (Signed)
Subjective:  Pt seen and examined in AM. No acute events overnight. She reports her dyspnea with exertion has resolved. She has mild LE edema but denies orthopnea. She is making good urine output.   Objective: Vital signs in last 24 hours: Filed Vitals:   12/01/14 2100 12/02/14 0500 12/02/14 1140 12/02/14 1331  BP: 132/53 125/56 150/66 117/51  Pulse: 79 82 80 124  Temp: 98.4 F (36.9 C) 98.3 F (36.8 C)  98 F (36.7 C)  TempSrc:    Oral  Resp: 18 18  18   Height:      Weight:  234 lb (106.142 kg)    SpO2: 99% 96%  95%   Weight change: -9 lb 1.6 oz (-4.128 kg)  Intake/Output Summary (Last 24 hours) at 12/02/14 1456 Last data filed at 12/02/14 1354  Gross per 24 hour  Intake    621 ml  Output   1950 ml  Net  -1329 ml   General Apperance: NAD HEENT: Normocephalic, atraumatic, PERRL, EOMI, anicteric sclera Neck: Supple, trachea midline Lungs: Clear to auscultation bilaterally. No wheezes, rhonchi or rales. Breathing comfortably on RA Heart: Regular rate and rhythm, 2/6 systolic murmur  Abdomen: Soft, nontender, nondistended, no rebound/guarding Extremities: Normal, atraumatic, warm and well perfused, trace bilateral LE edema Pulses: 2+ throughout Skin: No rashes or lesions Neurologic: Alert and oriented x 3. Speech fluent without aphasia. CNII-XII intact. Normal strength and sensation bilaterally. DTR 1+ and symmetric.  Lab Results: Basic Metabolic Panel:  Recent Labs Lab 12/01/14 0630 12/02/14 0425  NA 138 135  K 3.6 3.7  CL 96 95*  CO2 28 26  GLUCOSE 109* 147*  BUN 68* 74*  CREATININE 2.44* 2.56*  CALCIUM 8.5 8.1*  MG 2.3  --   PHOS 3.7 4.3   Liver Function Tests:  Recent Labs Lab 12/01/14 0630 12/02/14 0425  AST 14  --   ALT 27  --   ALKPHOS 40  --   BILITOT 0.5  --   PROT 6.7  --   ALBUMIN 3.2* 3.2*   CBC:  Recent Labs Lab 12/01/14 0630 12/02/14 0425  WBC 8.9 8.1  HGB 9.7* 11.2*  HCT 29.2* 33.2*  MCV 86.4 84.5  PLT 179 201    Cardiac Enzymes:  Recent Labs Lab 11/30/14 1323 11/30/14 1650 11/30/14 2338  TROPONINI <0.03 <0.03 <0.03   CBG:  Recent Labs Lab 12/01/14 0746 12/01/14 1159 12/01/14 1751 12/01/14 2113 12/02/14 0733 12/02/14 1209  GLUCAP 93 177* 234* 191* 112* 157*   Hemoglobin A1C:  Recent Labs Lab 11/30/14 1323  HGBA1C 8.2*   Fasting Lipid Panel:  Recent Labs Lab 12/01/14 0630  CHOL 181  HDL 46  LDLCALC 117*  TRIG 89  CHOLHDL 3.9   Thyroid Function Tests:  Recent Labs Lab 12/01/14 0500  TSH 0.633   Coagulation:  Recent Labs Lab 11/30/14 1323 12/01/14 0630 12/02/14 0425  LABPROT 14.4 15.0 15.7*  INR 1.11 1.16 1.23   Anemia Panel:  Recent Labs Lab 11/30/14 1323  VITAMINB12 1141*  FOLATE >20.0  FERRITIN 42  TIBC 310  IRON 38*  RETICCTPCT 2.5   Urinalysis:  Recent Labs Lab 11/30/14 0934  COLORURINE YELLOW  LABSPEC 1.009  PHURINE 5.5  GLUCOSEU 100*  HGBUR NEGATIVE  BILIRUBINUR NEGATIVE  KETONESUR NEGATIVE  PROTEINUR NEGATIVE  UROBILINOGEN 0.2  NITRITE NEGATIVE  LEUKOCYTESUR NEGATIVE   Studies/Results: No results found. Medications: I have reviewed the patient's current medications. Scheduled Meds: . amLODipine  10 mg Oral Daily  .  brimonidine  1 drop Both Eyes Q12H  . furosemide  80 mg Oral BID  . insulin aspart  0-5 Units Subcutaneous QHS  . insulin aspart  0-9 Units Subcutaneous TID WC  . insulin glargine  40 Units Subcutaneous QHS  . latanoprost  1 drop Both Eyes QHS  . levothyroxine  50 mcg Oral QAC breakfast  . lidocaine  1 patch Transdermal Q24H  . metoprolol succinate  50 mg Oral Daily  . sodium chloride  3 mL Intravenous Q12H  . warfarin  7.5 mg Oral ONCE-1800  . Warfarin - Pharmacist Dosing Inpatient   Does not apply q1800   Continuous Infusions: . heparin 1,300 Units/hr (12/02/14 0626)   PRN Meds:.sodium chloride, acetaminophen, ondansetron (ZOFRAN) IV, sodium chloride Assessment/Plan: Principal Problem:   Acute  exacerbation of CHF (congestive heart failure) Active Problems:   HTN (hypertension)   Hypothyroidism   DM2 (diabetes mellitus, type 2)   Atrial fibrillation   CKD (chronic kidney disease), stage III  Acute on chronic dCHF in setting of Moderate Aortic Stenosis: Denies symptoms and currently euvolemic on exam with 95% SpO2 on RA. Pt with 9-lb wt loss and net 5.4 L out. 2D-echo on 12/01/14 with EF 55-60% with moderate AS (valve area 1.53 and mean gradient 24 mm Hg), mild MR, and mild pulmonary HTN. Etiology of exacerbation most likely from symptomatic aortic stenosis. Pt denies dietary indiscretion or medical noncompliance.    -Appreciate cardiology recommendations  -Change IV Lasix 40 mg twice a day to equivalent PO 80 mg BID   -Continue toprol-XL 50 mg daily   -No ACE/ACEI or spironolactone due to CKD Stage 4 -Monitor electrolytes and renal function, in and out, daily wts -Continue 2g salt and 1800 mL fluid restricted diet -Pt needs outpatient follow-up with her cardiologist Dr. Einar Gip  -Consider aortic valve replacement or TAVR as risk of mortality is 1.5 year if left untreated  Chronic Atrial fibrillation: INR subtherapeutic at 1.23.  Pt on rate control with metoprolol and Coumadin7.5 mg daily except on Mon and Fri when she takes 5 mg daily for [facility] therapy.  -Continue with heparin and coumadin per pharmacy -Monitor INR with goal 2-3  -Continue with toprol-XL 50 mg daily -Per Dr. Nadyne Coombes may be good candidate for pradaxa 75 mg BID due to history of subtherapeutic INR's  Hypertension: Currently normotensive. Pt at home on amlodipine 10 mg daily, toprol-XL 50 mg daily, Bumex 4 mg twice a day and Chlorthalidone 25 mg daily. -Change IV Lasix 40 mg twice a day to equivalent PO 80 mg BID   -Continue toprol-XL 50 mg daily -Continue amlodipine 10 mg daily  DM type II: Last CBG 157 this AM. Last A1c 8.2. Pt at home on Lantus 35 units every morning and  NovoLog TID that she has not used it in the  last several weeks.  -Lantus 40 units every morning -Continue SSI at bedtime and sensitive with meals  -Frequent CBG monitoring  Non-oliguric AKI on CKD stage 3-4: Cr up to 2.56 from 2.44 in setting of aggressive diuresis, was 2.62 on admission with unclear new baseline creatinine (around 1.8-1.9 three years ago). -Obtain results from her PCP regarding recent renal function. -Will continue with diuresis and see if her renal function improves.  -Consider further work up for AKI if she does not improve.  Anemia secondary to CKD: Hemoglobin 11 above baseline 8-9. likely due to chronic renal disease. Anemia panel on 11/30/14 consistent with ACD  (CKD). -Continue to monitor  Hypothyroidism:  Last TSH normal on 12/01/14. -Continue levothyroxine 50 mcg daily   Hyperlipidemia - Last lipid panel on 4/16  with LDL 117 not at goal <100.  -Change home simvastatin 20 mg daily to high intensity lipitor 40 mg daily    FEN: Low salt with fluid restriction  VTE ppx: Continue coumadin. Heparin gtt   Dispo: Disposition is deferred at this time, awaiting improvement of current medical problems.  Anticipated discharge in approximately 1-2 day(s).   The patient does have a current PCP Tamsen Roers, MD) and does not need an [facility] hospital follow-up appointment after discharge.  The patient does not have transportation limitations that hinder transportation to clinic appointments.  .Services Needed at time of discharge: Y = Yes, Blank = No PT: SNF vs 24hr supervision/assist  OT:   RN:   Equipment: Rolling walker with 5" wheels  Other:     LOS: 2 days   Juluis Mire, MD 12/02/2014, 2:56 PM

## 2014-12-03 LAB — GLUCOSE, CAPILLARY
GLUCOSE-CAPILLARY: 215 mg/dL — AB (ref 70–99)
Glucose-Capillary: 97 mg/dL (ref 70–99)

## 2014-12-03 LAB — PROTIME-INR
INR: 1.32 (ref 0.00–1.49)
Prothrombin Time: 16.5 seconds — ABNORMAL HIGH (ref 11.6–15.2)

## 2014-12-03 LAB — CBC
HCT: 33.4 % — ABNORMAL LOW (ref 36.0–46.0)
HEMOGLOBIN: 11.3 g/dL — AB (ref 12.0–15.0)
MCH: 28.5 pg (ref 26.0–34.0)
MCHC: 33.8 g/dL (ref 30.0–36.0)
MCV: 84.1 fL (ref 78.0–100.0)
Platelets: 197 10*3/uL (ref 150–400)
RBC: 3.97 MIL/uL (ref 3.87–5.11)
RDW: 14.6 % (ref 11.5–15.5)
WBC: 8.8 10*3/uL (ref 4.0–10.5)

## 2014-12-03 LAB — BASIC METABOLIC PANEL
Anion gap: 15 (ref 5–15)
BUN: 81 mg/dL — ABNORMAL HIGH (ref 6–23)
CHLORIDE: 98 mmol/L (ref 96–112)
CO2: 25 mmol/L (ref 19–32)
CREATININE: 2.57 mg/dL — AB (ref 0.50–1.10)
Calcium: 8 mg/dL — ABNORMAL LOW (ref 8.4–10.5)
GFR calc non Af Amer: 16 mL/min — ABNORMAL LOW (ref >90)
GFR, EST AFRICAN AMERICAN: 18 mL/min — AB (ref >90)
Glucose, Bld: 130 mg/dL — ABNORMAL HIGH (ref 70–99)
Potassium: 4.1 mmol/L (ref 3.5–5.1)
SODIUM: 138 mmol/L (ref 135–145)

## 2014-12-03 LAB — HEPARIN LEVEL (UNFRACTIONATED): Heparin Unfractionated: 0.39 IU/mL (ref 0.30–0.70)

## 2014-12-03 LAB — MAGNESIUM: MAGNESIUM: 2.2 mg/dL (ref 1.5–2.5)

## 2014-12-03 MED ORDER — WARFARIN SODIUM 7.5 MG PO TABS: 7.5000 mg | ORAL_TABLET | Freq: Once | ORAL | Status: DC

## 2014-12-03 MED ORDER — ATORVASTATIN CALCIUM 40 MG PO TABS: 40.0000 mg | ORAL_TABLET | Freq: Every day | ORAL | Status: AC

## 2014-12-03 MED ORDER — INSULIN GLARGINE 100 UNIT/ML ~~LOC~~ SOLN: 35.0000 [IU] | Freq: Every day | SUBCUTANEOUS | Status: AC

## 2014-12-03 MED ORDER — APIXABAN 2.5 MG PO TABS
2.5000 mg | ORAL_TABLET | Freq: Two times a day (BID) | ORAL | Status: DC
  Administered 2014-12-03: 2.5 mg via ORAL
  Filled 2014-12-03: qty 1

## 2014-12-03 MED ORDER — FUROSEMIDE 80 MG PO TABS: 80.0000 mg | ORAL_TABLET | Freq: Two times a day (BID) | ORAL | Status: AC

## 2014-12-03 MED ORDER — LIDOCAINE 5 % EX PTCH: 1.0000 | MEDICATED_PATCH | CUTANEOUS | Status: DC

## 2014-12-03 MED ORDER — APIXABAN 2.5 MG PO TABS: 2.5000 mg | ORAL_TABLET | Freq: Two times a day (BID) | ORAL | Status: AC

## 2014-12-03 MED ORDER — INSULIN ASPART 100 UNIT/ML FLEXPEN: PEN_INJECTOR | SUBCUTANEOUS | Status: AC

## 2014-12-03 NOTE — Progress Notes (Signed)
Physical Therapy Treatment Patient Details Name: [name] MRN: [id number] DOB: 22-Jul-1926 Today's Date: 12/03/2014    History of Present Illness 81 yo female with onset of SOB and CHF exacerbation is also having undiagnosed cause L knee pain    PT Comments    Patient progressing with mobility.  Feel she will be safe for d/c home with intermittent assist of family who lives next door.  She progressed with ambulation with her cane from home with safe technique, just slower.  Also had on her shoes from home with helped due to neuropathy.  Follow Up Recommendations  Home health PT;Supervision - Intermittent     Equipment Recommendations  None recommended by PT    Recommendations for Other Services       Precautions / Restrictions Precautions Precautions: Fall Restrictions Weight Bearing Restrictions: No    Mobility  Bed Mobility               General bed mobility comments: up in recliner  Transfers Overall transfer level: Needs assistance Equipment used: Straight cane (with tripod tip) Transfers: Sit to/from Stand Sit to Stand: Min assist         General transfer comment: difficulty rising from low recliner; reports just got lift chair  Ambulation/Gait Ambulation/Gait assistance: Supervision Ambulation Distance (Feet): 200 Feet Assistive device: Straight cane Gait Pattern/deviations: Step-through pattern;Wide base of support;Decreased stride length   Gait velocity interpretation: Below normal speed for age/gender General Gait Details: donned shoes for ambulation due to peripheral neuropathy and fallen arch L>R; able to ambulate unaided with her cane from home; slower to start and able to speed up with distance in hallway; met friend in hallway and able to stand to converse and adjust balance without difficultty   Stairs            Wheelchair Mobility    Modified Rankin (Stroke Patients Only)       Balance Overall balance assessment:  Needs assistance           Standing balance-Leahy Scale: Fair Standing balance comment: balance with cane or without lifting it at times when moving from place to place in room                    Cognition Arousal/Alertness: Awake/alert Behavior During Therapy: [facility] for tasks assessed/performed Overall Cognitive Status: Within Functional Limits for tasks assessed                      Exercises General Exercises - Lower Extremity Hip ABduction/ADduction: AROM;Both;10 reps;Standing (both adduction x 10, then abduction x 10) Heel Raises: AROM;10 reps;Standing Mini-Sqauts: AROM;Both;10 reps;Standing    General Comments General comments (skin integrity, edema, etc.): noted left pes anserine swelling greater than right and tenderness possible cause of knee pain; reports had hip steriod injection a week ago that has also helped her knee      Pertinent Vitals/Pain Pain Assessment: No/denies pain Pain Location: had Tylenol controlling knee pain today    Home Living                      Prior Function            PT Goals (current goals can now be found in the care plan section) Progress towards PT goals: Progressing toward goals    Frequency  Min 3X/week    PT Plan Discharge plan needs to be updated    Co-evaluation  End of Session Equipment Utilized During Treatment: Gait belt Activity Tolerance: Patient tolerated treatment well Patient left: in chair;with call bell/phone within reach     Time: 0905-0941 PT Time Calculation (min) (ACUTE ONLY): 36 min  Charges:  $Gait Training: 8-22 mins $Therapeutic Exercise: 8-22 mins                    G Codes:      Jerusalem Brownstein,CYNDI 12-05-2014, 10:21 AM  Magda Kiel, [place] 2014-12-05

## 2014-12-03 NOTE — Progress Notes (Signed)
Subjective: No acute events overnight. She reports she is doing well and feels better overall. Denies CP or SOB. Tolerating PO intake. Voiding without difficulty.  Objective: Vital signs in last 24 hours: Filed Vitals:   12/02/14 1331 12/02/14 2100 12/03/14 0500 12/03/14 1408  BP: 117/51 137/51 114/65 146/59  Pulse: 124 102 83 87  Temp: 98 F (36.7 C) 98.7 F (37.1 C) 97.7 F (36.5 C) 98 F (36.7 C)  TempSrc: Oral   Oral  Resp: 18 17 18 16   Height:      Weight:   230 lb 14.4 oz (104.736 kg)   SpO2: 95% 94% 96% 95%   Weight change: -3 lb 1.6 oz (-1.406 kg)  Intake/Output Summary (Last 24 hours) at 12/03/14 1521 Last data filed at 12/03/14 1300  Gross per 24 hour  Intake    860 ml  Output   1500 ml  Net   -640 ml   General Apperance: NAD HEENT: Normocephalic, atraumatic, PERRL, EOMI, anicteric sclera Neck: Supple, trachea midline Lungs: Clear to auscultation bilaterally. No wheezes, rhonchi or rales. Breathing comfortably on RA Heart: Regular rate and rhythm, 2/6 systolic murmur  Abdomen: Soft, nontender, nondistended, no rebound/guarding Extremities: Normal, atraumatic, warm and well perfused, trace bilateral LE edema Pulses: 2+ throughout Skin: No rashes or lesions Neurologic: Alert and oriented x 3. Speech fluent without aphasia. CNII-XII intact. Normal strength and sensation bilaterally. DTR 1+ and symmetric.  Lab Results: Basic Metabolic Panel:  Recent Labs Lab 12/01/14 0630 12/02/14 0425 12/03/14 0554  NA 138 135 138  K 3.6 3.7 4.1  CL 96 95* 98  CO2 28 26 25   GLUCOSE 109* 147* 130*  BUN 68* 74* 81*  CREATININE 2.44* 2.56* 2.57*  CALCIUM 8.5 8.1* 8.0*  MG 2.3  --  2.2  PHOS 3.7 4.3  --    Liver Function Tests:  Recent Labs Lab 12/01/14 0630 12/02/14 0425  AST 14  --   ALT 27  --   ALKPHOS 40  --   BILITOT 0.5  --   PROT 6.7  --   ALBUMIN 3.2* 3.2*   CBC:  Recent Labs Lab 12/02/14 0425 12/03/14 0554  WBC 8.1 8.8  HGB 11.2* 11.3*    HCT 33.2* 33.4*  MCV 84.5 84.1  PLT 201 197   Cardiac Enzymes:  Recent Labs Lab 11/30/14 1323 11/30/14 1650 11/30/14 2338  TROPONINI <0.03 <0.03 <0.03   CBG:  Recent Labs Lab 12/02/14 0733 12/02/14 1209 12/02/14 1705 12/02/14 2117 12/03/14 0744 12/03/14 1149  GLUCAP 112* 157* 192* 196* 97 215*   Hemoglobin A1C:  Recent Labs Lab 11/30/14 1323  HGBA1C 8.2*   Fasting Lipid Panel:  Recent Labs Lab 12/01/14 0630  CHOL 181  HDL 46  LDLCALC 117*  TRIG 89  CHOLHDL 3.9   Thyroid Function Tests:  Recent Labs Lab 12/01/14 0500  TSH 0.633   Coagulation:  Recent Labs Lab 11/30/14 1323 12/01/14 0630 12/02/14 0425 12/03/14 0554  LABPROT 14.4 15.0 15.7* 16.5*  INR 1.11 1.16 1.23 1.32   Anemia Panel:  Recent Labs Lab 11/30/14 1323  VITAMINB12 1141*  FOLATE >20.0  FERRITIN 42  TIBC 310  IRON 38*  RETICCTPCT 2.5   Urinalysis:  Recent Labs Lab 11/30/14 0934  COLORURINE YELLOW  LABSPEC 1.009  PHURINE 5.5  GLUCOSEU 100*  HGBUR NEGATIVE  BILIRUBINUR NEGATIVE  KETONESUR NEGATIVE  PROTEINUR NEGATIVE  UROBILINOGEN 0.2  NITRITE NEGATIVE  LEUKOCYTESUR NEGATIVE   Studies/Results: No results found. Medications: I have  reviewed the patient's current medications. Scheduled Meds: . amLODipine  10 mg Oral Daily  . apixaban  2.5 mg Oral BID  . atorvastatin  40 mg Oral q1800  . brimonidine  1 drop Both Eyes Q12H  . furosemide  80 mg Oral BID  . insulin aspart  0-5 Units Subcutaneous QHS  . insulin aspart  0-9 Units Subcutaneous TID WC  . insulin glargine  40 Units Subcutaneous QHS  . latanoprost  1 drop Both Eyes QHS  . levothyroxine  50 mcg Oral QAC breakfast  . lidocaine  1 patch Transdermal Q24H  . metoprolol succinate  50 mg Oral Daily  . sodium chloride  3 mL Intravenous Q12H   Continuous Infusions:   PRN Meds:.sodium chloride, acetaminophen, ondansetron (ZOFRAN) IV, sodium chloride Assessment/Plan: Principal Problem:   Acute  exacerbation of CHF (congestive heart failure) Active Problems:   HTN (hypertension)   Hypothyroidism   DM2 (diabetes mellitus, type 2)   Atrial fibrillation   CKD (chronic kidney disease), stage III  Acute on chronic dCHF in setting of Moderate Aortic Stenosis: Denies symptoms and currently euvolemic on exam with 95% SpO2 on RA. 2D-echo on 12/01/14 with EF 55-60% with moderate AS (valve area 1.53 and mean gradient 24 mm Hg), mild MR, and mild pulmonary HTN. Etiology of exacerbation most likely from symptomatic aortic stenosis. Pt denies dietary indiscretion or medical noncompliance.    -Appreciate cardiology recommendations  -Continue Lasix PO 80 mg BID   -Continue toprol-XL 50 mg daily   -No ACE/ACEI or spironolactone due to CKD Stage 4 -Monitor electrolytes and renal function, in and out, daily wts -Continue 2g salt and 1800 mL fluid restricted diet -Pt needs outpatient follow-up with her cardiologist Dr. Einar Gip  -Consider aortic valve replacement or TAVR as risk of mortality is 1.5 year if left untreated  Chronic Atrial fibrillation: INR subtherapeutic at 1.23.  Pt on rate control with metoprolol and Coumadin7.5 mg daily except on Mon and Fri when she takes 5 mg daily for [facility] therapy.  -Discontinue heparin gtt -Continue with toprol-XL 50 mg daily -Start eliquis 2.5mg  BID  Hypertension: Currently normotensive. Pt at home on amlodipine 10 mg daily, toprol-XL 50 mg daily, Bumex 4 mg twice a day and Chlorthalidone 25 mg daily. -Continue Lasix PO 80 mg BID   -Continue toprol-XL 50 mg daily -Continue amlodipine 10 mg daily  DM type II: Last A1c 8.2. Pt at home on Lantus 35 units every morning and  NovoLog TID that she has not used it in the last several weeks.  -Lantus 40 units every morning -Continue SSI at bedtime and sensitive with meals  -Frequent CBG monitoring  Non-oliguric AKI on CKD stage 3-4: Cr stable.  -She will need it rechecked at outpatient follow up  Anemia secondary to  CKD: Hemoglobin 11 above baseline 8-9. likely due to chronic renal disease. Anemia panel on 11/30/14 consistent with ACD  (CKD). -Continue to monitor  Hypothyroidism: Last TSH normal on 12/01/14. -Continue levothyroxine 50 mcg daily   Hyperlipidemia - Last lipid panel on 4/16  with LDL 117 not at goal <100.  -Change home simvastatin 20 mg daily to high intensity lipitor 40 mg daily   FEN: Low salt with fluid restriction  VTE ppx: Eliquis  Dispo: Likely home today  The patient does have a current PCP Tamsen Roers, MD) and does not need an [facility] hospital follow-up appointment after discharge.  The patient does not have transportation limitations that hinder transportation to clinic appointments.  Marland Kitchen  Services Needed at time of discharge: Y = Yes, Blank = No PT: Home health PT  OT:   RN:   Equipment: Rolling walker with 5" wheels  Other:     LOS: 3 days   Milagros Loll, MD 12/03/2014, 3:21 PM

## 2014-12-03 NOTE — Care Management Note (Signed)
    Page 1 of 1   12/03/2014     2:18:57 PM CARE MANAGEMENT NOTE 12/03/2014  Patient:  NERY, KALISZ   Account Number:  [id number]  Date Initiated:  11/30/2014  Documentation initiated by:  [facility]  Subjective/Objective Assessment:   shortness of breath     Action/Plan:   Anticipated DC Date:     Anticipated DC Plan:  [place]  CM consult      Choice offered to / List presented to:             Status of service:  Completed, signed off Medicare Important Message given?  YES (If response is "NO", the following Medicare IM given date fields will be blank) Date Medicare IM given:  12/03/2014 Medicare IM given by:  Elenor Quinones Date Additional Medicare IM given:   Additional Medicare IM given by:    Discharge Disposition:  HOME/SELF CARE  Per UR Regulation:  Reviewed for med. necessity/level of care/duration of stay  If discussed at [place] of Stay Meetings, dates discussed:    Comments:  Pt declined home health services as ordered.  MD is aware. CM gave pt Eliquis 30 day card, pt was informed that she would need a precription in additon to card to recevie benifit.  CM contacted [place] and was informed that pt monthly copay would be $19.  Pt was informed of montlhy copay.

## 2014-12-03 NOTE — Progress Notes (Signed)
ANTICOAGULATION CONSULT NOTE - Follow Up Consult  Pharmacy Consult for Heparin and Coumadin Indication: atrial fibrillation  No Known Allergies  Patient Measurements: Height: 5\' 6"  (167.6 cm) Weight: 230 lb 14.4 oz (104.736 kg) IBW/kg (Calculated) : 59.3  Heparin dosing weight: 86 kg  Vital Signs: Temp: 97.7 F (36.5 C) (04/18 0500) BP: 114/65 mmHg (04/18 0500) Pulse Rate: 83 (04/18 0500)  Labs:  Recent Labs  11/30/14 1323 11/30/14 1650 11/30/14 2338  12/01/14 0630 12/01/14 1555 12/02/14 0425 12/03/14 0554  HGB  --   --   --   < > 9.7*  --  11.2* 11.3*  HCT  --   --   --   --  29.2*  --  33.2* 33.4*  PLT  --   --   --   --  179  --  201 197  LABPROT 14.4  --   --   --  15.0  --  15.7* 16.5*  INR 1.11  --   --   --  1.16  --  1.23 1.32  HEPARINUNFRC  --   --   --   < > 0.20* 0.33 0.47 0.39  CREATININE  --   --   --   --  2.44*  --  2.56* 2.57*  TROPONINI <0.03 <0.03 <0.03  --   --   --   --   --   < > = values in this interval not displayed. Estimated Creatinine Clearance: 19.2 mL/min (by C-G formula based on Cr of 2.57).  Assessment: 43 YOF admitted on 11/30/2014 with SOB. Pharmacy consulted to dose heparin with Coumadin for PAF (CHADS2VASc 8). INR on admit was SUBtherapeutic at 1.11.  Heparin level therapeutic, INR = 1.32, no bleeding noted  Home dose: 7.5 mg daily except 5mg  on Mon/Fri, last dose ?4/14 (Patient reported that she has not missed any doses PTA; however, she is unsure about 4/14 dose)  Goal of Therapy:  Heparin level 0.3-0.7 units/ml Monitor platelets by anticoagulation protocol: Yes  INR = 2 to 3   Plan:  Continue heparin drip at 1300 units/hr Coumadin 7.5 mg PO x 1 tonight  Daily HL/INR/CBC Monitor for bleeding  Thank you Anette Guarneri, PharmD [phone]  12/03/2014 10:02 AM

## 2014-12-04 DIAGNOSIS — I350 Nonrheumatic aortic (valve) stenosis: Secondary | ICD-10-CM

## 2014-12-04 DIAGNOSIS — I10 Essential (primary) hypertension: Secondary | ICD-10-CM | POA: Insufficient documentation

## 2014-12-04 DIAGNOSIS — I272 Other secondary pulmonary hypertension: Secondary | ICD-10-CM

## 2014-12-04 DIAGNOSIS — E1165 Type 2 diabetes mellitus with hyperglycemia: Secondary | ICD-10-CM

## 2014-12-04 DIAGNOSIS — I482 Chronic atrial fibrillation, unspecified: Secondary | ICD-10-CM | POA: Insufficient documentation

## 2014-12-04 NOTE — Discharge Summary (Signed)
Patient ID: [name], female   DOB: 1925-11-19, 81 y.o.   MRN: [id number] Medicine attending discharge note: I personally examined this patient on the day of hospital discharge and I attest to the accuracy of the evaluation and discharge plan as recorded by resident physician Dr. Audria Nine.  Clinical summary: 81 year old woman with hypertension, type 2 diabetes, hypothyroidism, chronic renal insufficiency, chronic atrial fibrillation on anticoagulation with warfarin, obstructive sleep apnea. She has a hypertensive cardiomyopathy with most recent echocardiogram prior to this hospitalization done November 2013 showing left ventricular systolic function 56-21 percent with normal wall motion. Mild aortic valve stenosis and mild mitral regurgitation at that time. She was admitted to the hospital in November 2013 with congestive heart failure. No subsequent hospitalizations until the current time when she was abetted on 11/30/2014 with progressive dyspnea with mild exertion and increasing leg edema progressive over a few weeks prior to presentation. She denied chest pain, orthopnea, or PND. No infectious triggers.  Initial exam by Dr. Jessee Avers showed an elderly woman in no distress.    11/30/14 1330 11/30/14 1400 11/30/14 1700 11/30/14 1723  BP: 136/63 144/81 143/79 136/76  Pulse: 90 93 78 100  Temp:  98.1 F (36.7 C)  98.1 F (36.7 C)  TempSrc:  Oral  Oral  Resp: 21 20 25 13   Height:      Weight:      SpO2: 91% 95%         Pertinent physical findings: Irregularly irregular cardiac rhythm, 3/6 systolic murmur, no S3 gallop, bibasilar rales over the lung fields, 2+ pitting edema of the extremities.  Hospital course: Myocardial infarction was ruled out. Patient was evaluated by her cardiologist Dr. Einar Gip. Follow-up echocardiogram done on 12/01/2014 shows overall stable left ventricular function but progressive aortic valve disease with  moderate stenosis. Associated trivial regurgitation. Moderate to severe left atrial enlargement. Mild pulmonary hypertension. Her weight was up 35 pounds compare with last office visit with cardiology. She was diuresed. Her symptoms improved rapidly. She may be a candidate for aortic valve repair in the future.  With respect to anticoagulation for her atrial fibrillation, we got the history that there was difficulty monitoring her warfarin levels. Cardiologist recommended changing to a alternative anticoagulant. Given her significant renal dysfunction with estimated GFR 18-20 mL/m, we felt that the safest choice would be apixiban 2.5 mg twice daily since this drug is only 25% cleared by the kidney. 2.5 mg twice daily is a 50% dose reduction from the standard dose in view of her age and renal function.  She is discharged in stable condition. She'll follow-up with her cardiologist and her primary care physician. There were no complications.

## 2014-12-08 NOTE — Discharge Summary (Signed)
Name: [name] MRN: [id number] DOB: 06-12-1926 81 y.o. PCP: Tamsen Roers, MD  Date of Admission: 11/30/2014  7:00 AM Date of Discharge: 12/03/2014 Attending Physician: Murriel Hopper, MD  Discharge Diagnosis: Principal Problem:   Acute exacerbation of CHF (congestive heart failure) Active Problems:   HTN (hypertension)   Hypothyroidism   DM2 (diabetes mellitus, type 2)   Atrial fibrillation   CKD (chronic kidney disease), stage III   Chronic atrial fibrillation   Essential hypertension   Aortic stenosis, moderate  Discharge Medications:   Medication List    STOP taking these medications        bumetanide 2 MG tablet  Commonly known as:  BUMEX     chlorthalidone 25 MG tablet  Commonly known as:  HYGROTON     insulin aspart 100 UNIT/ML injection  Commonly known as:  NOVOLOG FLEXPEN  Replaced by:  insulin aspart 100 UNIT/ML FlexPen     warfarin 5 MG tablet  Commonly known as:  COUMADIN      TAKE these medications        acetaminophen 500 MG tablet  Commonly known as:  TYLENOL  Take 500-1,000 mg by mouth every 6 (six) hours as needed for moderate pain.     acetaminophen 325 MG tablet  Commonly known as:  TYLENOL  Take 650 mg by mouth every 6 (six) hours as needed for mild pain.     amLODipine 10 MG tablet  Commonly known as:  NORVASC  Take 1 tablet (10 mg total) by mouth daily.     apixaban 2.5 MG Tabs tablet  Commonly known as:  ELIQUIS  Take 1 tablet (2.5 mg total) by mouth 2 (two) times daily.     aspirin 81 MG tablet  Take 81 mg by mouth daily.     atorvastatin 40 MG tablet  Commonly known as:  LIPITOR  Take 1 tablet (40 mg total) by mouth daily at 6 PM.     brimonidine 0.1 % Soln  Commonly known as:  ALPHAGAN P  Place 1 drop into both eyes every 12 (twelve) hours.     CENTRUM SILVER PO  Take 1 tablet by mouth daily.     cycloSPORINE 0.05 % ophthalmic emulsion  Commonly known as:  RESTASIS  Place 1 drop into both eyes every 12  (twelve) hours.     furosemide 80 MG tablet  Commonly known as:  LASIX  Take 1 tablet (80 mg total) by mouth 2 (two) times daily.     insulin aspart 100 UNIT/ML FlexPen  Commonly known as:  NOVOLOG  Inject 15 Units into the skin 3 (three) times daily with meals.     insulin glargine 100 UNIT/ML injection  Commonly known as:  LANTUS  Inject 0.35 mLs (35 Units total) into the skin daily.     levothyroxine 50 MCG tablet  Commonly known as:  SYNTHROID, LEVOTHROID  Take 50 mcg by mouth daily.     lidocaine 5 %  Commonly known as:  LIDODERM  Place 1 patch onto the skin daily. Remove & Discard patch within 12 hours or as directed by MD     LUMIGAN 0.01 % Soln  Generic drug:  bimatoprost  Place 1 drop into both eyes at bedtime.     metoprolol succinate 50 MG 24 hr tablet  Commonly known as:  TOPROL-XL  Take 50 mg by mouth daily. Take with or immediately following a meal.     predniSONE 5 MG tablet  Commonly known as:  DELTASONE     SYSTANE OP  Place 1-4 drops into both eyes 4 (four) times daily as needed. For dry eyes        Disposition and follow-up:   [name] was discharged from [facility] in Stable condition.  At the hospital follow up visit please address:  1.  Acute Exacerbation of CHF: Discharged on Lasix PO 80mg  BID. Her weight on admission was 243lb and on discharge was 230lb. Chronic Atrial Fibrillation: She was discontinued on her Coumadin and started on Eliquis 2.5mg  BID due to subtherapeutic INR. DM2 (diabetes mellitus, type 2): Hgb A1c 8.2 on 11/30/2014.  2.  Labs / imaging needed at time of follow-up: BMP  3.  Pending labs/ test needing follow-up: none  Follow-up Appointments:     Follow-up Information    Follow up with Laverda Page, MD In 1 month.   Specialty:  Cardiology   Why:  Please follow up with your previously scheduled appointment.   Contact information:   [address] [place]  [address] [phone]       Follow up with Tamsen Roers, MD On 12/05/2014.   Specialty:  Family Medicine   Why:  1:15PM for hospital follow up   Contact information:   [address] [place] [address] [phone]       Discharge Instructions: Discharge Instructions    Call MD for:  difficulty breathing, headache or visual disturbances    Complete by:  As directed      Call MD for:  persistant dizziness or light-headedness    Complete by:  As directed      Call MD for:  persistant nausea and vomiting    Complete by:  As directed      Call MD for:  severe uncontrolled pain    Complete by:  As directed      Call MD for:  temperature >100.4    Complete by:  As directed      Diet - low sodium heart healthy    Complete by:  As directed      Diet Carb Modified    Complete by:  As directed      Increase activity slowly    Complete by:  As directed            Consultations: Treatment Team:  Adrian Prows, MD  Procedures Performed:  Dg Chest 2 View  11/30/2014   CLINICAL DATA:  Worsening shortness of breath, leg swelling  EXAM: CHEST  2 VIEW  COMPARISON:  06/30/2012  FINDINGS: There is bilateral diffuse reticular mild interstitial prominence suspicious for interstitial edema or pneumonitis. No segmental infiltrate. Mild hyperinflation. Osteopenia and mild degenerative changes thoracic spine.  IMPRESSION: Diffuse bilateral reticular interstitial prominence suspicious for interstitial edema or pneumonitis. No segmental infiltrate. Mild hyperinflation.   Electronically Signed   By: Lahoma Crocker M.D.   On: 11/30/2014 08:21    2D Echo:  Study Conclusions  - Left ventricle: The cavity size was normal. Wall thickness was normal. Systolic function was normal. The estimated ejection fraction was in the range of 55% to 60%. Wall motion was normal; there were no regional wall motion abnormalities. - Aortic valve: Valve mobility was restricted. There was moderate stenosis. There was  trivial regurgitation. Valve area (VTI): 1.53 cm^2. Valve area (Vmax): 1.3 cm^2. Valve area (Vmean): 1.24 cm^2. - Mitral valve: Calcified annulus. Mildly thickened leaflets . There was mild regurgitation. -  Left atrium: The atrium was moderately to severely dilated. - Pulmonary arteries: Systolic pressure was mildly increased. PA peak pressure: 31 mm Hg (S).  Impressions:  - Normal LV function; moderate to severe LAE; calcified aortic valve with moderate AS and trace AI; mild MR; trace TR with mildly elevated pulmonary pressure.  Transthoracic echocardiography. M-mode, complete 2D, spectral Doppler, and color Doppler. Birthdate: Patient birthdate: 1926-05-01. Age: Patient is 81 yr old. Sex: Gender: female. BMI: 39.2 kg/m^2. Blood pressure:   112/55 Patient status: Inpatient. Study date: Study date: 12/01/2014. Study time: 10:07 AM. Location: Bedside.  Admission HPI: This is an 81 year old woman with past medical history of DM 2, diastolic CHF, chronic atrial fibrillation, obesity, CKD stage 3-4, Anemia, who presents to [facility] with increasing dyspnea on exertion over the past several weeks. She states that she has notes that she gets more short of breath with minimal exertion and she also reports increased bilateral lower leg extremity. Denies cough or other pulmonary symptoms. She has no orthopnea. No chest pain, no palpitations. She's been compliant with her home diuretic therapy. He denies dietary indiscretions. No constitutional symptoms. In the ED she was found to have increased interstitial edema on chest x-ray and therefore, the teaching service was consulted for admission for further therapy.  Hospital Course by problem list: Principal Problem:   Acute exacerbation of CHF (congestive heart failure) Active Problems:   HTN (hypertension)   Hypothyroidism   DM2 (diabetes mellitus, type 2)   Atrial fibrillation   CKD (chronic kidney disease), stage III    Chronic atrial fibrillation   Essential hypertension   Aortic stenosis, moderate   1. Acute Exacerbation of CHF: Symptoms and physical examination findings consistent with acute CHF exacerbation. She has a history of noncompliance with low sodium diet and admits to this. Exacerbation likely due to her noncompliance with dietary restrictions with contribution from renal insufficiency and aortic stenosis. She's been compliant with her oral diuretic therapy including Bumex 4mg  bid and Chlorthalidone 25 mg daily. EKG on admission with new Q waves. On repeat in evening, no Q waves. Troponins negative x 3. Chest x-ray reveals interstitial edema. She was admitted to telemetry. Her home Bumex was held and she was started on IV Lasix 40mg  BID. She diuresed well. Echo on 4/16 with LV EF 55-60%, moderate aortic stenosis, mild pulmonary HTN. She was seen in consultation by Cardiology, Dr. Einar Gip. She was transitioned to Lasix PO 80mg  BID prior to discharge. Her weight on admission was 243lb and on discharge was 230lb. She has follow up with cardiology.  2. Chronic Atrial Fibrillation: Rate controlled with Metoprolol. She was discontinued on her Coumadin and started on Eliquis 2.5mg  BID due to subtherapeutic INR. She was also continued on Metoprolol XL 50 mg daily.  3. Hypertension: She was continued on amlodipine 10 mg daily, toprol-XL 50 mg daily. Her Chlortalidone was discontinued due to her CKD.  4. DM2 (diabetes mellitus, type 2): Hgb A1c 8.2 on 11/30/2014. Pt at home on Lantus 35 units every morning and NovoLog TID that she has not used it in the last several weeks. She was continued on her home regimen at discharge.  5. CKD (chronic kidney disease), stage IV: Cr 2.62 with BUN 70 on admission. At discharge, her creatinine was table at 2.57.  6. Hypothyroidism: continued with home Levothyroxine   Discharge Vitals:   BP 146/59 mmHg  Pulse 87  Temp(Src) 98 F (36.7 C) (Oral)  Resp 16  Ht 5\' 6"  (1.676 m)  Wt 230 lb 14.4 oz (104.736 kg)  BMI 37.29 kg/m2  SpO2 95%  Discharge Labs:  No results found for this or any previous visit (from the past 24 hour(s)).  Signed: Milagros Loll, MD 12/08/2014, 7:24 AM    Services Ordered on Discharge: Home health PT, RN, Social work Equipment Ordered on Discharge: Rolling walker with 5" wheels

## 2014-12-31 ENCOUNTER — Other Ambulatory Visit: Payer: Self-pay | Admitting: Pulmonary Disease

## 2015-01-05 ENCOUNTER — Other Ambulatory Visit: Payer: Self-pay | Admitting: Pulmonary Disease

## 2015-02-11 ENCOUNTER — Ambulatory Visit (INDEPENDENT_AMBULATORY_CARE_PROVIDER_SITE_OTHER): Payer: Medicare Other | Admitting: Ophthalmology

## 2015-02-11 DIAGNOSIS — E11319 Type 2 diabetes mellitus with unspecified diabetic retinopathy without macular edema: Secondary | ICD-10-CM | POA: Diagnosis not present

## 2015-02-11 DIAGNOSIS — H35033 Hypertensive retinopathy, bilateral: Secondary | ICD-10-CM | POA: Diagnosis not present

## 2015-02-11 DIAGNOSIS — E11329 Type 2 diabetes mellitus with mild nonproliferative diabetic retinopathy without macular edema: Secondary | ICD-10-CM

## 2015-02-11 DIAGNOSIS — H43813 Vitreous degeneration, bilateral: Secondary | ICD-10-CM | POA: Diagnosis not present

## 2015-02-11 DIAGNOSIS — I10 Essential (primary) hypertension: Secondary | ICD-10-CM

## 2015-09-02 ENCOUNTER — Ambulatory Visit (INDEPENDENT_AMBULATORY_CARE_PROVIDER_SITE_OTHER): Payer: Medicare Other | Admitting: Ophthalmology

## 2015-09-02 DIAGNOSIS — H35033 Hypertensive retinopathy, bilateral: Secondary | ICD-10-CM | POA: Diagnosis not present

## 2015-09-02 DIAGNOSIS — I10 Essential (primary) hypertension: Secondary | ICD-10-CM

## 2015-09-02 DIAGNOSIS — E113393 Type 2 diabetes mellitus with moderate nonproliferative diabetic retinopathy without macular edema, bilateral: Secondary | ICD-10-CM

## 2015-09-02 DIAGNOSIS — H43813 Vitreous degeneration, bilateral: Secondary | ICD-10-CM

## 2015-09-02 DIAGNOSIS — H47232 Glaucomatous optic atrophy, left eye: Secondary | ICD-10-CM | POA: Diagnosis not present

## 2015-09-02 DIAGNOSIS — E11319 Type 2 diabetes mellitus with unspecified diabetic retinopathy without macular edema: Secondary | ICD-10-CM | POA: Diagnosis not present

## 2015-11-08 ENCOUNTER — Emergency Department (HOSPITAL_COMMUNITY): Payer: Medicare Other

## 2015-11-08 ENCOUNTER — Encounter (HOSPITAL_COMMUNITY): Payer: Self-pay

## 2015-11-08 DIAGNOSIS — Z66 Do not resuscitate: Secondary | ICD-10-CM | POA: Diagnosis present

## 2015-11-08 DIAGNOSIS — I5033 Acute on chronic diastolic (congestive) heart failure: Secondary | ICD-10-CM | POA: Diagnosis present

## 2015-11-08 DIAGNOSIS — I214 Non-ST elevation (NSTEMI) myocardial infarction: Secondary | ICD-10-CM | POA: Diagnosis not present

## 2015-11-08 DIAGNOSIS — J9600 Acute respiratory failure, unspecified whether with hypoxia or hypercapnia: Secondary | ICD-10-CM | POA: Diagnosis present

## 2015-11-08 DIAGNOSIS — Z515 Encounter for palliative care: Secondary | ICD-10-CM | POA: Diagnosis present

## 2015-11-08 DIAGNOSIS — I482 Chronic atrial fibrillation, unspecified: Secondary | ICD-10-CM | POA: Diagnosis present

## 2015-11-08 DIAGNOSIS — G4733 Obstructive sleep apnea (adult) (pediatric): Secondary | ICD-10-CM | POA: Diagnosis present

## 2015-11-08 DIAGNOSIS — Z794 Long term (current) use of insulin: Secondary | ICD-10-CM

## 2015-11-08 DIAGNOSIS — E119 Type 2 diabetes mellitus without complications: Secondary | ICD-10-CM

## 2015-11-08 DIAGNOSIS — R079 Chest pain, unspecified: Secondary | ICD-10-CM | POA: Diagnosis present

## 2015-11-08 DIAGNOSIS — I249 Acute ischemic heart disease, unspecified: Secondary | ICD-10-CM | POA: Diagnosis present

## 2015-11-08 DIAGNOSIS — N183 Chronic kidney disease, stage 3 unspecified: Secondary | ICD-10-CM

## 2015-11-08 DIAGNOSIS — I4891 Unspecified atrial fibrillation: Secondary | ICD-10-CM | POA: Diagnosis present

## 2015-11-08 DIAGNOSIS — K219 Gastro-esophageal reflux disease without esophagitis: Secondary | ICD-10-CM | POA: Diagnosis present

## 2015-11-08 DIAGNOSIS — Z7901 Long term (current) use of anticoagulants: Secondary | ICD-10-CM

## 2015-11-08 DIAGNOSIS — N179 Acute kidney failure, unspecified: Secondary | ICD-10-CM | POA: Diagnosis present

## 2015-11-08 DIAGNOSIS — E1122 Type 2 diabetes mellitus with diabetic chronic kidney disease: Secondary | ICD-10-CM | POA: Diagnosis present

## 2015-11-08 DIAGNOSIS — E039 Hypothyroidism, unspecified: Secondary | ICD-10-CM | POA: Diagnosis present

## 2015-11-08 DIAGNOSIS — I130 Hypertensive heart and chronic kidney disease with heart failure and stage 1 through stage 4 chronic kidney disease, or unspecified chronic kidney disease: Secondary | ICD-10-CM | POA: Diagnosis present

## 2015-11-08 DIAGNOSIS — I5023 Acute on chronic systolic (congestive) heart failure: Secondary | ICD-10-CM | POA: Diagnosis present

## 2015-11-08 DIAGNOSIS — H409 Unspecified glaucoma: Secondary | ICD-10-CM | POA: Diagnosis present

## 2015-11-08 DIAGNOSIS — Z79899 Other long term (current) drug therapy: Secondary | ICD-10-CM

## 2015-11-08 DIAGNOSIS — I10 Essential (primary) hypertension: Secondary | ICD-10-CM | POA: Diagnosis present

## 2015-11-08 DIAGNOSIS — I350 Nonrheumatic aortic (valve) stenosis: Secondary | ICD-10-CM | POA: Diagnosis present

## 2015-11-08 DIAGNOSIS — Z8673 Personal history of transient ischemic attack (TIA), and cerebral infarction without residual deficits: Secondary | ICD-10-CM

## 2015-11-08 DIAGNOSIS — D509 Iron deficiency anemia, unspecified: Secondary | ICD-10-CM | POA: Diagnosis present

## 2015-11-08 DIAGNOSIS — E7800 Pure hypercholesterolemia, unspecified: Secondary | ICD-10-CM | POA: Diagnosis present

## 2015-11-08 DIAGNOSIS — Z8542 Personal history of malignant neoplasm of other parts of uterus: Secondary | ICD-10-CM

## 2015-11-08 LAB — BASIC METABOLIC PANEL
ANION GAP: 15 (ref 5–15)
BUN: 58 mg/dL — ABNORMAL HIGH (ref 6–20)
CO2: 27 mmol/L (ref 22–32)
Calcium: 9.4 mg/dL (ref 8.9–10.3)
Chloride: 98 mmol/L — ABNORMAL LOW (ref 101–111)
Creatinine, Ser: 5.2 mg/dL — ABNORMAL HIGH (ref 0.44–1.00)
GFR calc Af Amer: 8 mL/min — ABNORMAL LOW (ref >60)
GFR calc non Af Amer: 7 mL/min — ABNORMAL LOW (ref >60)
GLUCOSE: 257 mg/dL — AB (ref 65–99)
POTASSIUM: 4.6 mmol/L (ref 3.5–5.1)
Sodium: 140 mmol/L (ref 135–145)

## 2015-11-08 LAB — CBC
HEMATOCRIT: 28.9 % — AB (ref 36.0–46.0)
Hemoglobin: 9.3 g/dL — ABNORMAL LOW (ref 12.0–15.0)
MCH: 29.8 pg (ref 26.0–34.0)
MCHC: 32.2 g/dL (ref 30.0–36.0)
MCV: 92.6 fL (ref 78.0–100.0)
Platelets: 216 10*3/uL (ref 150–400)
RBC: 3.12 MIL/uL — ABNORMAL LOW (ref 3.87–5.11)
RDW: 14.2 % (ref 11.5–15.5)
WBC: 9.2 10*3/uL (ref 4.0–10.5)

## 2015-11-08 LAB — I-STAT TROPONIN, ED: Troponin i, poc: 0.01 ng/mL (ref 0.00–0.08)

## 2015-11-08 LAB — PROTIME-INR
INR: 1.21 (ref 0.00–1.49)
Prothrombin Time: 15.5 seconds — ABNORMAL HIGH (ref 11.6–15.2)

## 2015-11-08 MED ORDER — ASPIRIN 81 MG PO CHEW
324.0000 mg | CHEWABLE_TABLET | Freq: Once | ORAL | Status: AC
  Administered 2015-11-08: 324 mg via ORAL
  Filled 2015-11-08: qty 4

## 2015-11-08 NOTE — ED Notes (Signed)
Pt here with c/o heaviness to central chest, onset tonight around 6pm associated with SOB. Her PCP has been wanting her to schedule a cardiac cath soon but pt has not scheduled it yet.

## 2015-11-08 NOTE — ED Provider Notes (Signed)
CSN: [id number]     Arrival date & time 10/20/2015  2127 History   First MD Initiated Contact with Patient 10/20/2015 2234     Chief Complaint  Patient presents with  . Chest Pain     (Consider location/radiation/quality/duration/timing/severity/associated sxs/prior Treatment) The history is provided by the patient and medical records. No language interpreter was used.     [name] is a 82 y.o. female  with a hx of HTN, a-fib, hypothyroid, TIA, CHF, IDDM, anticoagulated on Eliquis presents to the Emergency Department complaining of gradual, persistent, progressively worsening central chest pain and pressure onset 6pm tonight. Pt reports her chest pain is persistent in her bilateral shoulders and in her back.  She reports this pain has been occuring for the last several weeks when walking and has been associated with SOB.  Nothing makes the symptoms better. No recent illness.  Pt denies fever, chills, headache, neck pain, abd pain, N/V/D, weakness, dizziness, syncope, dysuria, diaphoresis.     Pt reports she saw Dr. Einar Gip several weeks ago who wanted to do a cardiac cath and then plan for a stent in her aortic valve.     Past Medical History  Diagnosis Date  . Hypertension   . Glaucoma   . Atrial fibrillation ([place])   . Hypothyroidism   . Endometrial cancer ([place])   . TIA (transient ischemic attack) 10/2011    "that's questionable" (06/29/12)  . Hypercholesterolemia   . CHF (congestive heart failure) ([place])   . Pneumonia 1980's    "couple times" (06/29/12)  . Shortness of breath     "just moving around; yesterday & today" (06/29/12)  . OSA on CPAP   . H/O hiatal hernia     "years and years ago" (06/29/12)  . GERD (gastroesophageal reflux disease)     "several years ago" (06/29/12)  . Arthritis     "in my toes" (06/29/12)  . Type II diabetes mellitus ([place]) 05/1994  . Fall at home 06/24/2012    "rolled out of bed getting alarm clark that had fallen on floor" (06/29/12)    Past Surgical History  Procedure Laterality Date  . Ankle fracture surgery  10/2007    right  . Abdominal hysterectomy  1996?  Marland Kitchen Cataract extraction w/ intraocular lens  implant, bilateral  2005?   History reviewed. No pertinent family history. Social History  Substance Use Topics  . Smoking status: Never Smoker   . Smokeless tobacco: Never Used  . Alcohol Use: No   OB History    No data available     Review of Systems  Constitutional: Negative for fever, diaphoresis, appetite change, fatigue and unexpected weight change.  HENT: Negative for mouth sores.   Eyes: Negative for visual disturbance.  Respiratory: Positive for shortness of breath (with exertion). Negative for cough, chest tightness and wheezing.   Cardiovascular: Positive for chest pain. Negative for leg swelling.  Gastrointestinal: Negative for nausea, vomiting, abdominal pain, diarrhea and constipation.  Endocrine: Negative for polydipsia, polyphagia and polyuria.  Genitourinary: Negative for dysuria, urgency, frequency and hematuria.  Musculoskeletal: Negative for back pain and neck stiffness.  Skin: Negative for rash.  Allergic/Immunologic: Negative for immunocompromised state.  Neurological: Negative for syncope, light-headedness and headaches.  Hematological: Does not bruise/bleed easily.  Psychiatric/Behavioral: Negative for sleep disturbance. The patient is not nervous/anxious.       Allergies  Review of patient's allergies indicates no known allergies.  Home Medications   Prior to Admission medications   Medication Sig  Start Date End Date Taking? Authorizing Provider  amLODipine (NORVASC) 10 MG tablet Take 1 tablet (10 mg total) by mouth daily. 07/03/12  Yes Modena Jansky, MD  apixaban (ELIQUIS) 2.5 MG TABS tablet Take 1 tablet (2.5 mg total) by mouth 2 (two) times daily. 12/03/14  Yes Milagros Loll, MD  bimatoprost (LUMIGAN) 0.01 % SOLN Place 1 drop into both eyes at bedtime.   Yes Historical  Provider, MD  brimonidine (ALPHAGAN P) 0.1 % SOLN Place 1 drop into both eyes every 12 (twelve) hours.   Yes Historical Provider, MD  calcium carbonate (OSCAL) 1500 (600 Ca) MG TABS tablet Take 600 mg of elemental calcium by mouth 2 (two) times daily with a meal.   Yes Historical Provider, MD  furosemide (LASIX) 80 MG tablet Take 1 tablet (80 mg total) by mouth 2 (two) times daily. 12/03/14  Yes Milagros Loll, MD  insulin aspart (NOVOLOG) 100 UNIT/ML FlexPen Inject 15 Units into the skin 3 (three) times daily with meals. Patient taking differently: Inject 5 Units into the skin 3 (three) times daily as needed for high blood sugar.  12/03/14  Yes Milagros Loll, MD  insulin glargine (LANTUS) 100 UNIT/ML injection Inject 0.35 mLs (35 Units total) into the skin daily. Patient taking differently: Inject 35-40 Units into the skin daily.  12/03/14  Yes Milagros Loll, MD  levothyroxine (SYNTHROID, LEVOTHROID) 50 MCG tablet Take 50 mcg by mouth daily.   Yes Historical Provider, MD  lisinopril (PRINIVIL,ZESTRIL) 10 MG tablet Take 10 mg by mouth daily.   Yes Historical Provider, MD  metoprolol succinate (TOPROL-XL) 50 MG 24 hr tablet Take 100 mg by mouth daily. Take with or immediately following a meal.   Yes Historical Provider, MD  Multiple Vitamins-Minerals (CENTRUM SILVER PO) Take 1 tablet by mouth daily.   Yes Historical Provider, MD  Omega-3 Fatty Acids (OMEGA 3 PO) Take 1 capsule by mouth daily.   Yes Historical Provider, MD  RESVERATROL PO Take 1 tablet by mouth daily.   Yes Historical Provider, MD  timolol (TIMOPTIC) 0.5 % ophthalmic solution Place 1 drop into both eyes daily. 08/13/15  Yes Historical Provider, MD  atorvastatin (LIPITOR) 40 MG tablet Take 1 tablet (40 mg total) by mouth daily at 6 PM. 12/03/14   Milagros Loll, MD  lidocaine (LIDODERM) 5 % Place 1 patch onto the skin daily. Remove & Discard patch within 12 hours or as directed by MD 12/03/14   Milagros Loll, MD   BP 133/69  mmHg  Pulse 95  Temp(Src) 99.2 F (37.3 C) (Oral)  Resp 16  SpO2 95% Physical Exam  Constitutional: She appears well-developed and well-nourished. No distress.  Awake, alert, nontoxic appearance  HENT:  Head: Normocephalic and atraumatic.  Mouth/Throat: Oropharynx is clear and moist. No oropharyngeal exudate.  Eyes: Conjunctivae are normal. No scleral icterus.  Neck: Normal range of motion. Neck supple.  Cardiovascular: Normal rate, regular rhythm and intact distal pulses.   Murmur heard. Pulmonary/Chest: Effort normal and breath sounds normal. No respiratory distress. She has no wheezes.  Equal chest expansion  Abdominal: Soft. Bowel sounds are normal. She exhibits no mass. There is no tenderness. There is no rebound and no guarding.  Musculoskeletal: Normal range of motion. She exhibits no edema.  Neurological: She is alert.  Speech is clear and goal oriented Moves extremities without ataxia  Skin: Skin is warm and dry. She is not diaphoretic.  Psychiatric: She has a normal mood and affect.  Nursing note and vitals reviewed.   ED Course  Procedures (including critical care time) Labs Review Labs Reviewed  BASIC METABOLIC PANEL - Abnormal; Notable for the following:    Chloride 98 (*)    Glucose, Bld 257 (*)    BUN 58 (*)    Creatinine, Ser 5.20 (*)    GFR calc non Af Amer 7 (*)    GFR calc Af Amer 8 (*)    All other components within normal limits  CBC - Abnormal; Notable for the following:    RBC 3.12 (*)    Hemoglobin 9.3 (*)    HCT 28.9 (*)    All other components within normal limits  PROTIME-INR - Abnormal; Notable for the following:    Prothrombin Time 15.5 (*)    All other components within normal limits  BRAIN NATRIURETIC PEPTIDE  I-STAT TROPOININ, ED    Imaging Review Dg Chest 2 View  11/01/2015  CLINICAL DATA:  Chest pain and shortness of breath today. Hypertension, diabetes. Nonsmoker. EXAM: CHEST  2 VIEW COMPARISON:  11/30/2014 FINDINGS: Cardiac  enlargement. Pulmonary vascular congestion. Diffuse interstitial process is similar previous study and likely represents chronic fibrosis. Recurrent acute interstitial infiltration or edema less likely. No blunting of costophrenic angles. No pneumothorax. Calcified and tortuous aorta. Degenerative changes in the shoulders. IMPRESSION: Cardiac enlargement with mild pulmonary vascular congestion. Diffuse interstitial process in the lungs is similar to previous studies, suggest chronic interstitial infiltration. It is possible this also represents recurrent acute edema or interstitial pneumonia. Electronically Signed   By: Lucienne Capers M.D.   On: 11/11/2015 22:43   I have personally reviewed and evaluated these images and lab results as part of my medical decision-making.   EKG Interpretation   Date/Time:  Friday November 08 2015 21:34:45 EDT Ventricular Rate:  105 PR Interval:    QRS Duration: 112 QT Interval:  368 QTC Calculation: 486 R Axis:   88 Text Interpretation:  Sinus rhythm Nonspecific ST and T wave abnormality  Abnormal ECG Confirmed by Alvino Chapel  MD, Ovid Curd [phone]) on 11/04/2015  10:50:12 PM      MDM   Final diagnoses:  Chest pain, unspecified chest pain type  Chronic atrial fibrillation (HCC)  Aortic stenosis, moderate  Essential hypertension  CKD (chronic kidney disease), stage III    [name] presents with a central chest pain and shortness of breath onset at 6 PM. Patient has had worsening dyspnea on exertion over the last several weeks but no chest pain at rest. Patient's chest pain 80s up into her shoulders. She is alert and oriented, in no distress. Harsh aortic stenosis murmur heard on exam.  She given aspirin. Initial troponin is negative.  Patient with history of A. fib but normal sinus on EKG.  Acute on chronic knee injury with an elevation of creatinine from 2.5 to 5.2 today. Mild anemia at 9.3. Chest x-ray shows mild pulmonary vascular congestion.     Discussed with Dr. Doylene Canard will evaluate and admit for further evaluation and treatment.    Sheila Soho Traivon Morrical, PA-C 11/09/15 0045  Davonna Belling, MD 11/09/15 2204

## 2015-11-09 ENCOUNTER — Inpatient Hospital Stay (HOSPITAL_COMMUNITY): Payer: Medicare Other

## 2015-11-09 DIAGNOSIS — I214 Non-ST elevation (NSTEMI) myocardial infarction: Secondary | ICD-10-CM | POA: Diagnosis present

## 2015-11-09 DIAGNOSIS — Z79899 Other long term (current) drug therapy: Secondary | ICD-10-CM | POA: Diagnosis not present

## 2015-11-09 DIAGNOSIS — Z7901 Long term (current) use of anticoagulants: Secondary | ICD-10-CM | POA: Diagnosis not present

## 2015-11-09 DIAGNOSIS — D509 Iron deficiency anemia, unspecified: Secondary | ICD-10-CM | POA: Diagnosis present

## 2015-11-09 DIAGNOSIS — J9600 Acute respiratory failure, unspecified whether with hypoxia or hypercapnia: Secondary | ICD-10-CM | POA: Diagnosis present

## 2015-11-09 DIAGNOSIS — I249 Acute ischemic heart disease, unspecified: Secondary | ICD-10-CM | POA: Diagnosis present

## 2015-11-09 DIAGNOSIS — E1122 Type 2 diabetes mellitus with diabetic chronic kidney disease: Secondary | ICD-10-CM | POA: Diagnosis present

## 2015-11-09 DIAGNOSIS — I350 Nonrheumatic aortic (valve) stenosis: Secondary | ICD-10-CM | POA: Diagnosis present

## 2015-11-09 DIAGNOSIS — I130 Hypertensive heart and chronic kidney disease with heart failure and stage 1 through stage 4 chronic kidney disease, or unspecified chronic kidney disease: Secondary | ICD-10-CM | POA: Diagnosis present

## 2015-11-09 DIAGNOSIS — I482 Chronic atrial fibrillation: Secondary | ICD-10-CM | POA: Diagnosis present

## 2015-11-09 DIAGNOSIS — Z66 Do not resuscitate: Secondary | ICD-10-CM | POA: Diagnosis present

## 2015-11-09 DIAGNOSIS — N183 Chronic kidney disease, stage 3 (moderate): Secondary | ICD-10-CM | POA: Diagnosis present

## 2015-11-09 DIAGNOSIS — G4733 Obstructive sleep apnea (adult) (pediatric): Secondary | ICD-10-CM | POA: Diagnosis present

## 2015-11-09 DIAGNOSIS — R079 Chest pain, unspecified: Secondary | ICD-10-CM | POA: Diagnosis present

## 2015-11-09 DIAGNOSIS — Z515 Encounter for palliative care: Secondary | ICD-10-CM | POA: Diagnosis present

## 2015-11-09 DIAGNOSIS — I5023 Acute on chronic systolic (congestive) heart failure: Secondary | ICD-10-CM | POA: Diagnosis present

## 2015-11-09 DIAGNOSIS — Z8542 Personal history of malignant neoplasm of other parts of uterus: Secondary | ICD-10-CM | POA: Diagnosis not present

## 2015-11-09 DIAGNOSIS — N179 Acute kidney failure, unspecified: Secondary | ICD-10-CM | POA: Diagnosis present

## 2015-11-09 DIAGNOSIS — Z8673 Personal history of transient ischemic attack (TIA), and cerebral infarction without residual deficits: Secondary | ICD-10-CM | POA: Diagnosis not present

## 2015-11-09 DIAGNOSIS — Z794 Long term (current) use of insulin: Secondary | ICD-10-CM | POA: Diagnosis not present

## 2015-11-09 DIAGNOSIS — E039 Hypothyroidism, unspecified: Secondary | ICD-10-CM | POA: Diagnosis present

## 2015-11-09 DIAGNOSIS — E7800 Pure hypercholesterolemia, unspecified: Secondary | ICD-10-CM | POA: Diagnosis present

## 2015-11-09 DIAGNOSIS — K219 Gastro-esophageal reflux disease without esophagitis: Secondary | ICD-10-CM | POA: Diagnosis present

## 2015-11-09 DIAGNOSIS — H409 Unspecified glaucoma: Secondary | ICD-10-CM | POA: Diagnosis present

## 2015-11-09 LAB — CBC
HEMATOCRIT: 29.1 % — AB (ref 36.0–46.0)
HEMOGLOBIN: 9.7 g/dL — AB (ref 12.0–15.0)
MCH: 31 pg (ref 26.0–34.0)
MCHC: 33.3 g/dL (ref 30.0–36.0)
MCV: 93 fL (ref 78.0–100.0)
Platelets: 208 10*3/uL (ref 150–400)
RBC: 3.13 MIL/uL — ABNORMAL LOW (ref 3.87–5.11)
RDW: 14.2 % (ref 11.5–15.5)
WBC: 9.2 10*3/uL (ref 4.0–10.5)

## 2015-11-09 LAB — BASIC METABOLIC PANEL
ANION GAP: 14 (ref 5–15)
BUN: 59 mg/dL — ABNORMAL HIGH (ref 6–20)
CALCIUM: 9.4 mg/dL (ref 8.9–10.3)
CO2: 27 mmol/L (ref 22–32)
CREATININE: 5.06 mg/dL — AB (ref 0.44–1.00)
Chloride: 100 mmol/L — ABNORMAL LOW (ref 101–111)
GFR, EST AFRICAN AMERICAN: 8 mL/min — AB (ref >60)
GFR, EST NON AFRICAN AMERICAN: 7 mL/min — AB (ref >60)
Glucose, Bld: 196 mg/dL — ABNORMAL HIGH (ref 65–99)
Potassium: 4.6 mmol/L (ref 3.5–5.1)
SODIUM: 141 mmol/L (ref 135–145)

## 2015-11-09 LAB — ECHOCARDIOGRAM COMPLETE
HEIGHTINCHES: 66 in
Weight: 3668.45 oz

## 2015-11-09 LAB — LIPID PANEL
CHOLESTEROL: 192 mg/dL (ref 0–200)
HDL: 37 mg/dL — AB (ref >40)
LDL CALC: 122 mg/dL — AB (ref 0–99)
TRIGLYCERIDES: 167 mg/dL — AB (ref <150)
Total CHOL/HDL Ratio: 5.2 RATIO
VLDL: 33 mg/dL (ref 0–40)

## 2015-11-09 LAB — TROPONIN I
TROPONIN I: 0.62 ng/mL — AB (ref <0.031)
TROPONIN I: 2.46 ng/mL — AB (ref <0.031)
Troponin I: 0.04 ng/mL — ABNORMAL HIGH (ref <0.031)

## 2015-11-09 LAB — BRAIN NATRIURETIC PEPTIDE: B Natriuretic Peptide: 742.7 pg/mL — ABNORMAL HIGH (ref 0.0–100.0)

## 2015-11-09 MED ORDER — CALCIUM CARBONATE 1250 (500 CA) MG PO TABS
500.0000 mg | ORAL_TABLET | Freq: Two times a day (BID) | ORAL | Status: DC
  Administered 2015-11-09 – 2015-11-10 (×3): 500 mg via ORAL
  Filled 2015-11-09 (×3): qty 1

## 2015-11-09 MED ORDER — SODIUM CHLORIDE 0.9 % IV SOLN
INTRAVENOUS | Status: AC
  Administered 2015-11-09: 03:00:00 via INTRAVENOUS

## 2015-11-09 MED ORDER — FUROSEMIDE 80 MG PO TABS
80.0000 mg | ORAL_TABLET | Freq: Two times a day (BID) | ORAL | Status: DC
  Administered 2015-11-09 (×2): 80 mg via ORAL
  Filled 2015-11-09 (×3): qty 1

## 2015-11-09 MED ORDER — INSULIN GLARGINE 100 UNIT/ML ~~LOC~~ SOLN
35.0000 [IU] | Freq: Every day | SUBCUTANEOUS | Status: DC
  Administered 2015-11-09: 35 [IU] via SUBCUTANEOUS
  Filled 2015-11-09 (×2): qty 0.35

## 2015-11-09 MED ORDER — ONDANSETRON HCL 4 MG/2ML IJ SOLN: 4.0000 mg | Freq: Four times a day (QID) | INTRAMUSCULAR | Status: DC | PRN

## 2015-11-09 MED ORDER — ASPIRIN 300 MG RE SUPP: 300.0000 mg | RECTAL | Status: AC

## 2015-11-09 MED ORDER — FUROSEMIDE 10 MG/ML IJ SOLN
80.0000 mg | Freq: Once | INTRAMUSCULAR | Status: AC
  Administered 2015-11-09: 80 mg via INTRAVENOUS
  Filled 2015-11-09: qty 8

## 2015-11-09 MED ORDER — ATORVASTATIN CALCIUM 40 MG PO TABS
40.0000 mg | ORAL_TABLET | Freq: Every day | ORAL | Status: DC
  Administered 2015-11-09: 40 mg via ORAL
  Filled 2015-11-09: qty 1

## 2015-11-09 MED ORDER — LEVOTHYROXINE SODIUM 50 MCG PO TABS
50.0000 ug | ORAL_TABLET | Freq: Every day | ORAL | Status: DC
  Administered 2015-11-09 – 2015-11-10 (×2): 50 ug via ORAL
  Filled 2015-11-09 (×2): qty 1

## 2015-11-09 MED ORDER — SODIUM CHLORIDE 0.9 % IV SOLN: 250.0000 mL | INTRAVENOUS | Status: DC | PRN

## 2015-11-09 MED ORDER — SODIUM CHLORIDE 0.9% FLUSH: 3.0000 mL | INTRAVENOUS | Status: DC | PRN

## 2015-11-09 MED ORDER — NITROGLYCERIN 0.4 MG SL SUBL: 0.4000 mg | SUBLINGUAL_TABLET | SUBLINGUAL | Status: DC | PRN

## 2015-11-09 MED ORDER — BRIMONIDINE TARTRATE 0.2 % OP SOLN
1.0000 [drp] | Freq: Two times a day (BID) | OPHTHALMIC | Status: DC
  Administered 2015-11-09 – 2015-11-10 (×4): 1 [drp] via OPHTHALMIC
  Filled 2015-11-09: qty 5

## 2015-11-09 MED ORDER — ASPIRIN 81 MG PO CHEW: 324.0000 mg | CHEWABLE_TABLET | ORAL | Status: AC

## 2015-11-09 MED ORDER — METOPROLOL SUCCINATE ER 100 MG PO TB24
100.0000 mg | ORAL_TABLET | Freq: Every day | ORAL | Status: DC
  Administered 2015-11-09 – 2015-11-10 (×2): 100 mg via ORAL
  Filled 2015-11-09 (×2): qty 1

## 2015-11-09 MED ORDER — ACETAMINOPHEN 325 MG PO TABS
650.0000 mg | ORAL_TABLET | ORAL | Status: DC | PRN
  Administered 2015-11-09 – 2015-11-10 (×2): 650 mg via ORAL
  Filled 2015-11-09 (×3): qty 2

## 2015-11-09 MED ORDER — ASPIRIN EC 81 MG PO TBEC
81.0000 mg | DELAYED_RELEASE_TABLET | Freq: Every day | ORAL | Status: DC
  Administered 2015-11-10: 81 mg via ORAL
  Filled 2015-11-09: qty 1

## 2015-11-09 MED ORDER — LATANOPROST 0.005 % OP SOLN
1.0000 [drp] | Freq: Every day | OPHTHALMIC | Status: DC
  Administered 2015-11-09 (×2): 1 [drp] via OPHTHALMIC
  Filled 2015-11-09: qty 2.5

## 2015-11-09 MED ORDER — TIMOLOL MALEATE 0.5 % OP SOLN
1.0000 [drp] | Freq: Every day | OPHTHALMIC | Status: DC
  Administered 2015-11-09 – 2015-11-10 (×2): 1 [drp] via OPHTHALMIC

## 2015-11-09 MED ORDER — FUROSEMIDE 10 MG/ML IJ SOLN
40.0000 mg | Freq: Once | INTRAMUSCULAR | Status: AC
  Administered 2015-11-09: 40 mg via INTRAVENOUS
  Filled 2015-11-09: qty 4

## 2015-11-09 MED ORDER — AMLODIPINE BESYLATE 10 MG PO TABS
10.0000 mg | ORAL_TABLET | Freq: Every day | ORAL | Status: DC
  Administered 2015-11-10: 10 mg via ORAL
  Filled 2015-11-09 (×2): qty 1

## 2015-11-09 MED ORDER — APIXABAN 2.5 MG PO TABS
2.5000 mg | ORAL_TABLET | Freq: Two times a day (BID) | ORAL | Status: DC
  Administered 2015-11-09 (×2): 2.5 mg via ORAL
  Filled 2015-11-09 (×3): qty 1

## 2015-11-09 MED ORDER — SODIUM CHLORIDE 0.9% FLUSH
3.0000 mL | Freq: Two times a day (BID) | INTRAVENOUS | Status: DC
  Administered 2015-11-09 (×2): 3 mL via INTRAVENOUS

## 2015-11-09 NOTE — Progress Notes (Signed)
CRITICAL VALUE ALERT  Critical value received:  Troponin  0.62  Date of notification:  3.25.17  Time of notification:  0723  Critical value read back:   yes  Nurse who received alert:  Arnell Sieving, RN  MD notified (1st page):  Doylene Canard, A  Time of first page:  0726  Responding MD:  Jeanne Ivan  Time MD responded:  [phone]

## 2015-11-09 NOTE — Progress Notes (Signed)
Pt's labs came up as Troponin at 0.04 and BNP at 742.7.  Pt denied chest pains.  Dr. Doylene Canard was informed and ordered to change the rate of continuous fluid from 50 to 25 mL/hr.  Will continue to monitor pt. Lupita Dawn, RN

## 2015-11-09 NOTE — ED Notes (Signed)
Report attempted. RN to call back.

## 2015-11-09 NOTE — Progress Notes (Signed)
Pt is on NIV at this time tolerating it well.  Pt states her home setting is 12 but 12 she states is too much pressure on our device. Pressure was titrated to 10. Pt tolerating it well and pt state this is better. Noted pt more comfortably with 10 of pressure. No distress or complications noted.

## 2015-11-09 NOTE — H&P (Signed)
Referring Physician: Dr. Adrian Prows  [name] is an 82 y.o. female.                       Chief Complaint: Chest pain   HPI: [name] is a 82 y.o. female with a hx of HTN, a-fib, hypothyroid, TIA, CHF, IDDM, anticoagulated on Eliquis presents to the Emergency Department complaining of gradual, persistent, progressively worsening central chest pain and pressure onset 6pm tonight. Pt reports her chest pain is persistent in her bilateral shoulders and in her back. She reports this pain has been occuring for the last several weeks when walking and has been associated with SOB. Nothing makes the symptoms better. No recent illness. Pt denies fever, chills, headache, neck pain, abd pain, N/V/D, weakness, dizziness, syncope, dysuria, diaphoresis.   Past Medical History  Diagnosis Date  . Hypertension   . Glaucoma   . Atrial fibrillation ([place])   . Hypothyroidism   . Endometrial cancer ([place])   . TIA (transient ischemic attack) 10/2011    "that's questionable" (06/29/12)  . Hypercholesterolemia   . CHF (congestive heart failure) ([place])   . Pneumonia 1980's    "couple times" (06/29/12)  . Shortness of breath     "just moving around; yesterday & today" (06/29/12)  . OSA on CPAP   . H/O hiatal hernia     "years and years ago" (06/29/12)  . GERD (gastroesophageal reflux disease)     "several years ago" (06/29/12)  . Arthritis     "in my toes" (06/29/12)  . Type II diabetes mellitus ([place]) 05/1994  . Fall at home 06/24/2012    "rolled out of bed getting alarm clark that had fallen on floor" (06/29/12)      Past Surgical History  Procedure Laterality Date  . Ankle fracture surgery  10/2007    right  . Abdominal hysterectomy  1996?  Marland Kitchen Cataract extraction w/ intraocular lens  implant, bilateral  2005?    History reviewed. No pertinent family history. Social History:  reports that she has never smoked. She has never used smokeless tobacco. She reports that she does not  drink alcohol or use illicit drugs.  Allergies: No Known Allergies   (Not in a hospital admission)  Results for orders placed or performed during the hospital encounter of 11/12/2015 (from the past 48 hour(s))  Basic metabolic panel     Status: Abnormal   Collection Time: 11/14/2015  9:48 PM  Result Value Ref Range   Sodium 140 135 - 145 mmol/L   Potassium 4.6 3.5 - 5.1 mmol/L   Chloride 98 (L) 101 - 111 mmol/L   CO2 27 22 - 32 mmol/L   Glucose, Bld 257 (H) 65 - 99 mg/dL   BUN 58 (H) 6 - 20 mg/dL   Creatinine, Ser 5.20 (H) 0.44 - 1.00 mg/dL   Calcium 9.4 8.9 - 10.3 mg/dL   GFR calc non Af Amer 7 (L) >60 mL/min   GFR calc Af Amer 8 (L) >60 mL/min    Comment: (NOTE) The eGFR has been calculated using the CKD EPI equation. This calculation has not been validated in all clinical situations. eGFR's persistently <60 mL/min signify possible Chronic Kidney Disease.    Anion gap 15 5 - 15  CBC     Status: Abnormal   Collection Time: 10/25/2015  9:48 PM  Result Value Ref Range   WBC 9.2 4.0 - 10.5 K/uL   RBC 3.12 (L) 3.87 -  5.11 MIL/uL   Hemoglobin 9.3 (L) 12.0 - 15.0 g/dL   HCT 28.9 (L) 36.0 - 46.0 %   MCV 92.6 78.0 - 100.0 fL   MCH 29.8 26.0 - 34.0 pg   MCHC 32.2 30.0 - 36.0 g/dL   RDW 14.2 11.5 - 15.5 %   Platelets 216 150 - 400 K/uL  Protime-INR - (order if Patient is taking Coumadin / Warfarin)     Status: Abnormal   Collection Time: 10/25/2015  9:48 PM  Result Value Ref Range   Prothrombin Time 15.5 (H) 11.6 - 15.2 seconds   INR 1.21 0.00 - 1.49  I-stat troponin, ED (not at [facility])     Status: None   Collection Time: 10/21/2015 10:41 PM  Result Value Ref Range   Troponin i, poc 0.01 0.00 - 0.08 ng/mL   Comment 3            Comment: Due to the release kinetics of cTnI, a negative result within the first hours of the onset of symptoms does not rule out myocardial infarction with certainty. If myocardial infarction is still suspected, repeat the test at appropriate  intervals.    Dg Chest 2 View  10/23/2015  CLINICAL DATA:  Chest pain and shortness of breath today. Hypertension, diabetes. Nonsmoker. EXAM: CHEST  2 VIEW COMPARISON:  11/30/2014 FINDINGS: Cardiac enlargement. Pulmonary vascular congestion. Diffuse interstitial process is similar previous study and likely represents chronic fibrosis. Recurrent acute interstitial infiltration or edema less likely. No blunting of costophrenic angles. No pneumothorax. Calcified and tortuous aorta. Degenerative changes in the shoulders. IMPRESSION: Cardiac enlargement with mild pulmonary vascular congestion. Diffuse interstitial process in the lungs is similar to previous studies, suggest chronic interstitial infiltration. It is possible this also represents recurrent acute edema or interstitial pneumonia. Electronically Signed   By: Lucienne Capers M.D.   On: 10/28/2015 22:43    Review Of Systems Constitutional: Negative for fever, diaphoresis, appetite change, fatigue and unexpected weight change.  HENT: Negative for mouth sores.  Eyes: Negative for visual disturbance.  Respiratory: Positive for shortness of breath (with exertion). Negative for cough, chest tightness and wheezing.  Cardiovascular: Positive for chest pain. Negative for leg swelling.  Gastrointestinal: Negative for nausea, vomiting, abdominal pain, diarrhea and constipation.  Endocrine: Negative for polydipsia, polyphagia and polyuria.  Genitourinary: Negative for dysuria, urgency, frequency and hematuria.  Musculoskeletal: Negative for back pain and neck stiffness.  Skin: Negative for rash.  Allergic/Immunologic: Negative for immunocompromised state.  Neurological: Negative for syncope, light-headedness and headaches.  Hematological: Does not bruise/bleed easily.  Psychiatric/Behavioral: Negative for sleep disturbance. The patient is not nervous/anxious.   Blood pressure 133/69, pulse 95, temperature 99.2 F (37.3 C), temperature source  Oral, resp. rate 16, SpO2 95 %. Physical Exam  Constitutional: She appears well-developed and well-nourished. No distress.  HENT: Head: Normocephalic and atraumatic. Mouth/Throat: Oropharynx is clear and moist. Brown Eyes: Conjunctivae are normal. No scleral icterus.  Neck: Normal range of motion. Neck supple. + JVD at 30 degree angle. Cardiovascular: Normal rate, irregular rhythm and intact distal pulses.III/VI musical murmur at right and left sternal border. Pulmonary/Chest: Effort normal and breath sounds normal. No respiratory distress. She has basal crackles.  Abdominal: Soft. Bowel sounds are normal. There is no tenderness.   Musculoskeletal: Normal range of motion. She exhibits 1 + edema of both lower leg.  Neurological: She is alert. Moves all four extremities. Skin: Skin is warm and dry. She is not diaphoretic.  Psychiatric: She has a normal  mood and affect.  Nursing note and vitals reviewed.  Assessment/Plan Chest pain r/o MI Aortic stenosis Acute on chronic renal failure Acute on chronic left heart systolic failure Anemia, r/o iron deficiency DM, II Hypertension Hypothyroidism Atrial fibrillation with CHA2DS2VASc score of 6/9  Admit. R/O MI. Hold Ace-inhibitor Controlled hydration DNR.  Birdie Riddle, MD  11/09/2015, 1:09 AM

## 2015-11-10 LAB — COMPREHENSIVE METABOLIC PANEL
ALK PHOS: 25 U/L — AB (ref 38–126)
ALT: 13 U/L — AB (ref 14–54)
AST: 25 U/L (ref 15–41)
Albumin: 3 g/dL — ABNORMAL LOW (ref 3.5–5.0)
Anion gap: 14 (ref 5–15)
BILIRUBIN TOTAL: 0.7 mg/dL (ref 0.3–1.2)
BUN: 65 mg/dL — AB (ref 6–20)
CALCIUM: 9.6 mg/dL (ref 8.9–10.3)
CO2: 26 mmol/L (ref 22–32)
CREATININE: 5.07 mg/dL — AB (ref 0.44–1.00)
Chloride: 97 mmol/L — ABNORMAL LOW (ref 101–111)
GFR, EST AFRICAN AMERICAN: 8 mL/min — AB (ref >60)
GFR, EST NON AFRICAN AMERICAN: 7 mL/min — AB (ref >60)
Glucose, Bld: 132 mg/dL — ABNORMAL HIGH (ref 65–99)
Potassium: 5 mmol/L (ref 3.5–5.1)
Sodium: 137 mmol/L (ref 135–145)
TOTAL PROTEIN: 6.9 g/dL (ref 6.5–8.1)

## 2015-11-10 LAB — GLUCOSE, CAPILLARY
GLUCOSE-CAPILLARY: 75 mg/dL (ref 65–99)
GLUCOSE-CAPILLARY: 158 mg/dL — AB (ref 65–99)

## 2015-11-10 MED ORDER — MORPHINE SULFATE (PF) 2 MG/ML IV SOLN
1.0000 mg | Freq: Four times a day (QID) | INTRAVENOUS | Status: DC
  Administered 2015-11-10 (×2): 1 mg via INTRAVENOUS
  Filled 2015-11-10 (×2): qty 1

## 2015-11-10 MED ORDER — FUROSEMIDE 10 MG/ML IJ SOLN
80.0000 mg | Freq: Two times a day (BID) | INTRAMUSCULAR | Status: DC
  Administered 2015-11-10: 80 mg via INTRAVENOUS
  Filled 2015-11-10: qty 8

## 2015-11-10 MED ORDER — INFLUENZA VAC SPLIT QUAD 0.5 ML IM SUSY: 0.5000 mL | PREFILLED_SYRINGE | INTRAMUSCULAR | Status: DC | PRN

## 2015-11-10 MED ORDER — METOPROLOL TARTRATE 1 MG/ML IV SOLN
5.0000 mg | Freq: Once | INTRAVENOUS | Status: AC
  Administered 2015-11-10: 5 mg via INTRAVENOUS

## 2015-11-10 MED ORDER — METOPROLOL TARTRATE 1 MG/ML IV SOLN
INTRAVENOUS | Status: AC
  Filled 2015-11-10: qty 5

## 2015-11-16 NOTE — Progress Notes (Signed)
Ref: Tamsen Roers, MD   Subjective:  Had respiratory distress last night. Had 2 rounds of lasix with small improvement. Tachycardic. Afebrile.  Objective:  Vital Signs in the last 24 hours: Temp:  [97.8 F (36.6 C)-98.4 F (36.9 C)] 97.8 F (36.6 C) (03/26 0541) Pulse Rate:  [98-124] 124 (03/26 0541) Cardiac Rhythm:  [-] Atrial fibrillation (03/25 2120) Resp:  [20] 20 (03/26 0541) BP: (112-138)/(55-74) 112/70 mmHg (03/26 0541) SpO2:  [92 %-94 %] 92 % (03/26 0541) Weight:  [102.468 kg (225 lb 14.4 oz)] 102.468 kg (225 lb 14.4 oz) (03/26 0541)  Physical Exam: BP Readings from Last 1 Encounters:  11-16-15 112/70    Wt Readings from Last 1 Encounters:  11-16-15 102.468 kg (225 lb 14.4 oz)    Weight change: -1.532 kg (-3 lb 6.1 oz)  HEENT: [place]/AT, Eyes-Brown, PERL, EOMI, Conjunctiva-Pink, Sclera-Non-icteric Neck: + JVD, No bruit, Trachea midline. Lungs:  Basal crackles, Bilateral. Cardiac:  Regular rhythm, normal S1 and S2, no S3. III/VI musical systolic murmur. Abdomen:  Soft, non-tender. Extremities:  Trace edema present. No cyanosis. No clubbing. CNS: AxOx3, Cranial nerves grossly intact, moves all 4 extremities. Right handed. Skin: Warm and dry.   Intake/Output from previous day: 03/25 0701 - 03/26 0700 In: 720 [P.O.:720] Out: 1075 [Urine:1075]    Lab Results: BMET    Component Value Date/Time   NA 141 11/09/2015 0155   NA 140 10/21/2015 2148   NA 138 12/03/2014 0554   K 4.6 11/09/2015 0155   K 4.6 10/19/2015 2148   K 4.1 12/03/2014 0554   CL 100* 11/09/2015 0155   CL 98* 11/12/2015 2148   CL 98 12/03/2014 0554   CO2 27 11/09/2015 0155   CO2 27 11/05/2015 2148   CO2 25 12/03/2014 0554   GLUCOSE 196* 11/09/2015 0155   GLUCOSE 257* 10/20/2015 2148   GLUCOSE 130* 12/03/2014 0554   BUN 59* 11/09/2015 0155   BUN 58* 11/03/2015 2148   BUN 81* 12/03/2014 0554   CREATININE 5.06* 11/09/2015 0155   CREATININE 5.20* 10/20/2015 2148   CREATININE 2.57* 12/03/2014  0554   CALCIUM 9.4 11/09/2015 0155   CALCIUM 9.4 11/13/2015 2148   CALCIUM 8.0* 12/03/2014 0554   GFRNONAA 7* 11/09/2015 0155   GFRNONAA 7* 11/01/2015 2148   GFRNONAA 16* 12/03/2014 0554   GFRAA 8* 11/09/2015 0155   GFRAA 8* 11/01/2015 2148   GFRAA 18* 12/03/2014 0554   CBC    Component Value Date/Time   WBC 9.2 11/09/2015 0155   RBC 3.13* 11/09/2015 0155   RBC 3.60* 11/30/2014 1323   HGB 9.7* 11/09/2015 0155   HCT 29.1* 11/09/2015 0155   PLT 208 11/09/2015 0155   MCV 93.0 11/09/2015 0155   MCH 31.0 11/09/2015 0155   MCHC 33.3 11/09/2015 0155   RDW 14.2 11/09/2015 0155   LYMPHSABS 1.2 06/29/2012 1610   MONOABS 0.9 06/29/2012 1610   EOSABS 0.2 06/29/2012 1610   BASOSABS 0.1 06/29/2012 1610   HEPATIC Function Panel  Recent Labs  12/01/14 0630  PROT 6.7   HEMOGLOBIN A1C No components found for: HGA1C,  MPG CARDIAC ENZYMES Lab Results  Component Value Date   CKTOTAL 484* 10/19/2007   CKMB 1.6 10/19/2007   TROPONINI 2.46* 11/09/2015   TROPONINI 0.62* 11/09/2015   TROPONINI 0.04* 11/09/2015   BNP No results for input(s): PROBNP in the last 8760 hours. TSH  Recent Labs  12/01/14 0500  TSH 0.633   CHOLESTEROL  Recent Labs  12/01/14 0630 11/09/15 0155  CHOL 181  192    Scheduled Meds: . amLODipine  10 mg Oral Daily  . aspirin EC  81 mg Oral Daily  . atorvastatin  40 mg Oral q1800  . brimonidine  1 drop Both Eyes Q12H  . calcium carbonate  500 mg of elemental calcium Oral BID WC  . furosemide  80 mg Oral BID  . insulin glargine  35 Units Subcutaneous Daily  . latanoprost  1 drop Both Eyes QHS  . levothyroxine  50 mcg Oral QAC breakfast  . metoprolol succinate  100 mg Oral Daily  .  morphine injection  1 mg Intravenous Q6H WA  . sodium chloride flush  3 mL Intravenous Q12H  . timolol  1 drop Both Eyes Daily   Continuous Infusions:  PRN Meds:.sodium chloride, acetaminophen, Influenza vac split quadrivalent PF, nitroGLYCERIN, ondansetron (ZOFRAN) IV,  sodium chloride flush  Assessment/Plan: Chest pain r/o MI Aortic stenosis Acute on chronic renal failure Acute on chronic left heart systolic failure Anemia, r/o iron deficiency DM, II Hypertension Hypothyroidism Atrial fibrillation with CHA2DS2VASc score of 6/9  Continue diuresis.     LOS: 1 day    Sheila Dials  MD  December 09, 2015, 8:48 AM

## 2015-11-16 NOTE — Progress Notes (Signed)
While speaking with family notified that the patients heart rate had dropped down into the low 20's. Went immediately to the patients bedside and patient in agonal breathing. Patient was a DNR. Family at bedside with the patient. Patients heart stopped. Time of death announced at 1656 by Demetrios Loll RN and Dr Doylene Canard MD.   [place] donor notified not eligible full release. Reference number [id number]. -Danton Clap, Mervin Kung RN

## 2015-11-16 NOTE — Progress Notes (Signed)
Utilization review completedUtilization review completed 

## 2015-11-16 NOTE — Discharge Summary (Signed)
Physician Death Summary  Patient ID: [name] MRN: [id number] DOB/AGE: 80-Nov-1927 80 y.o.  Admit date: 11/09/2015 Discharge date: 11/06/2015  Admission Diagnoses: Chest pain r/o MI Aortic stenosis Acute on chronic renal failure Acute on chronic left heart systolic failure Anemia, r/o iron deficiency DM, II Hypertension Hypothyroidism Atrial fibrillation with CHA2DS2VASc score of 6/9  Discharge Diagnoses:  Principle problem: * Acute Non-STEMI* Active Problems:   Acute on chronic left heart diastolic failure   Acute renal failure   Acute respiratory failure   Hypothyroidism   Diabetes mellitus, type 2 (HCC)   Chronic Atrial fibrillation with CHA2DS2VASc score of 6/9 ([place])   CKD (chronic kidney disease), stage III   Essential hypertension   Aortic stenosis, severe   Anemia, probably iron deficiency   Chest pain    Discharged Condition: Expired at 16:56 hr on November 11, 2015.  Hospital Course:  82 y.o. female with a hx of HTN, a-fib, hypothyroid, TIA, CHF, IDDM, anticoagulated on Eliquis presented to the Emergency Department complaining of gradual, persistent, progressively worsening central chest pain and pressure onset 6 pm night of admission. Pt reported her chest pain was persistent in her bilateral shoulders and in her back. She reported this pain occuring for the last several weeks when walking and has been associated with SOB.Nothing made the symptoms better. No recent illness.Pt denied fever, chills, headache, neck pain, abd pain, N/V/D, weakness, dizziness, syncope, dysuria, diaphoresis. She had developed significant renal failure with creatinine of 5.20 in last 11 months. Her aortic stenosis was critical. Her troponin I levels gradually increased to 2.46 ng.  She was not a surgical candidate and she and her daughter chose DNR followed by comfort care when diuresis with IV lasix started dwindling. She was made comfortable with morphine use and expired peacefully on  Nov 11, 2015 at 4:56 PM.  Consults: cardiology  Significant Diagnostic Studies: labs: Normal WBC and Platelets count. Hgb of 9.7. BMET normal except BUN of 58, Cr of 5.20 and glucose of 257 mg/dl. Mildly elevated LDL cholesterol of 122 mg/ml, HDL of 37 and Triglyceride of 167 mg. Troponin-I of 0.04, 0.62 and 2.46 ng.  Treatments: analgesia: Lasix, Metoprolol, Amlodipine, Eliquis which was held for renal failure and IV Morphine q 6 hr with oxygen.  EKG-atrial fibrillation and lateral ischemia  Discharge Exam: Blood pressure 98/60, pulse 112, temperature 97.8 F (36.6 C), temperature source Oral, resp. rate 17, height 5\' 6"  (1.676 m), weight 102.468 kg (225 lb 14.4 oz), SpO2 93 %. HEENT: [place]/AT, Eyes-Brown, Conjunctiva-Pink, Sclera-Non-icteric. Mouth open. Neck: + JVD, No bruit, Trachea midline. Lungs:No breath sounds, Bilateral. Cardiac: No rhythm or heart sounds. Abdomen: Soft. Extremities: Trace edema present. No cyanosis. No clubbing. CNS: Unresponsive Skin: Warm and dry.  Disposition: 20-Expired.     Medication List    ASK your doctor about these medications        amLODipine 10 MG tablet  Commonly known as:  NORVASC  Take 1 tablet (10 mg total) by mouth daily.     apixaban 2.5 MG Tabs tablet  Commonly known as:  ELIQUIS  Take 1 tablet (2.5 mg total) by mouth 2 (two) times daily.     atorvastatin 40 MG tablet  Commonly known as:  LIPITOR  Take 1 tablet (40 mg total) by mouth daily at 6 PM.     brimonidine 0.1 % Soln  Commonly known as:  ALPHAGAN P  Place 1 drop into both eyes every 12 (twelve) hours.     calcium carbonate 1500 (600 Ca)  MG Tabs tablet  Commonly known as:  OSCAL  Take 600 mg of elemental calcium by mouth 2 (two) times daily with a meal.     CENTRUM SILVER PO  Take 1 tablet by mouth daily.     furosemide 80 MG tablet  Commonly known as:  LASIX  Take 1 tablet (80 mg total) by mouth 2 (two) times daily.     insulin aspart 100 UNIT/ML FlexPen   Commonly known as:  NOVOLOG  Inject 15 Units into the skin 3 (three) times daily with meals.     insulin glargine 100 UNIT/ML injection  Commonly known as:  LANTUS  Inject 0.35 mLs (35 Units total) into the skin daily.     levothyroxine 50 MCG tablet  Commonly known as:  SYNTHROID, LEVOTHROID  Take 50 mcg by mouth daily.     lisinopril 10 MG tablet  Commonly known as:  PRINIVIL,ZESTRIL  Take 10 mg by mouth daily.     LUMIGAN 0.01 % Soln  Generic drug:  bimatoprost  Place 1 drop into both eyes at bedtime.     metoprolol succinate 50 MG 24 hr tablet  Commonly known as:  TOPROL-XL  Take 100 mg by mouth daily. Take with or immediately following a meal.     OMEGA 3 PO  Take 1 capsule by mouth daily.     RESVERATROL PO  Take 1 tablet by mouth daily.     timolol 0.5 % ophthalmic solution  Commonly known as:  TIMOPTIC  Place 1 drop into both eyes daily.         SignedDixie Dials S 2015/11/30, 5:10 PM

## 2015-11-16 DEATH — deceased

## 2016-03-02 ENCOUNTER — Ambulatory Visit (INDEPENDENT_AMBULATORY_CARE_PROVIDER_SITE_OTHER): Payer: Medicare Other | Admitting: Ophthalmology

## 2017-09-28 IMAGING — CR DG CHEST 2V
2 series · 2 of 2 positions shown · non-contrast
Comparison: 11/30/2014

CLINICAL DATA: Chest pain and shortness of breath today.
Hypertension, diabetes. Nonsmoker.

EXAM:
CHEST  2 VIEW

[chest lat]
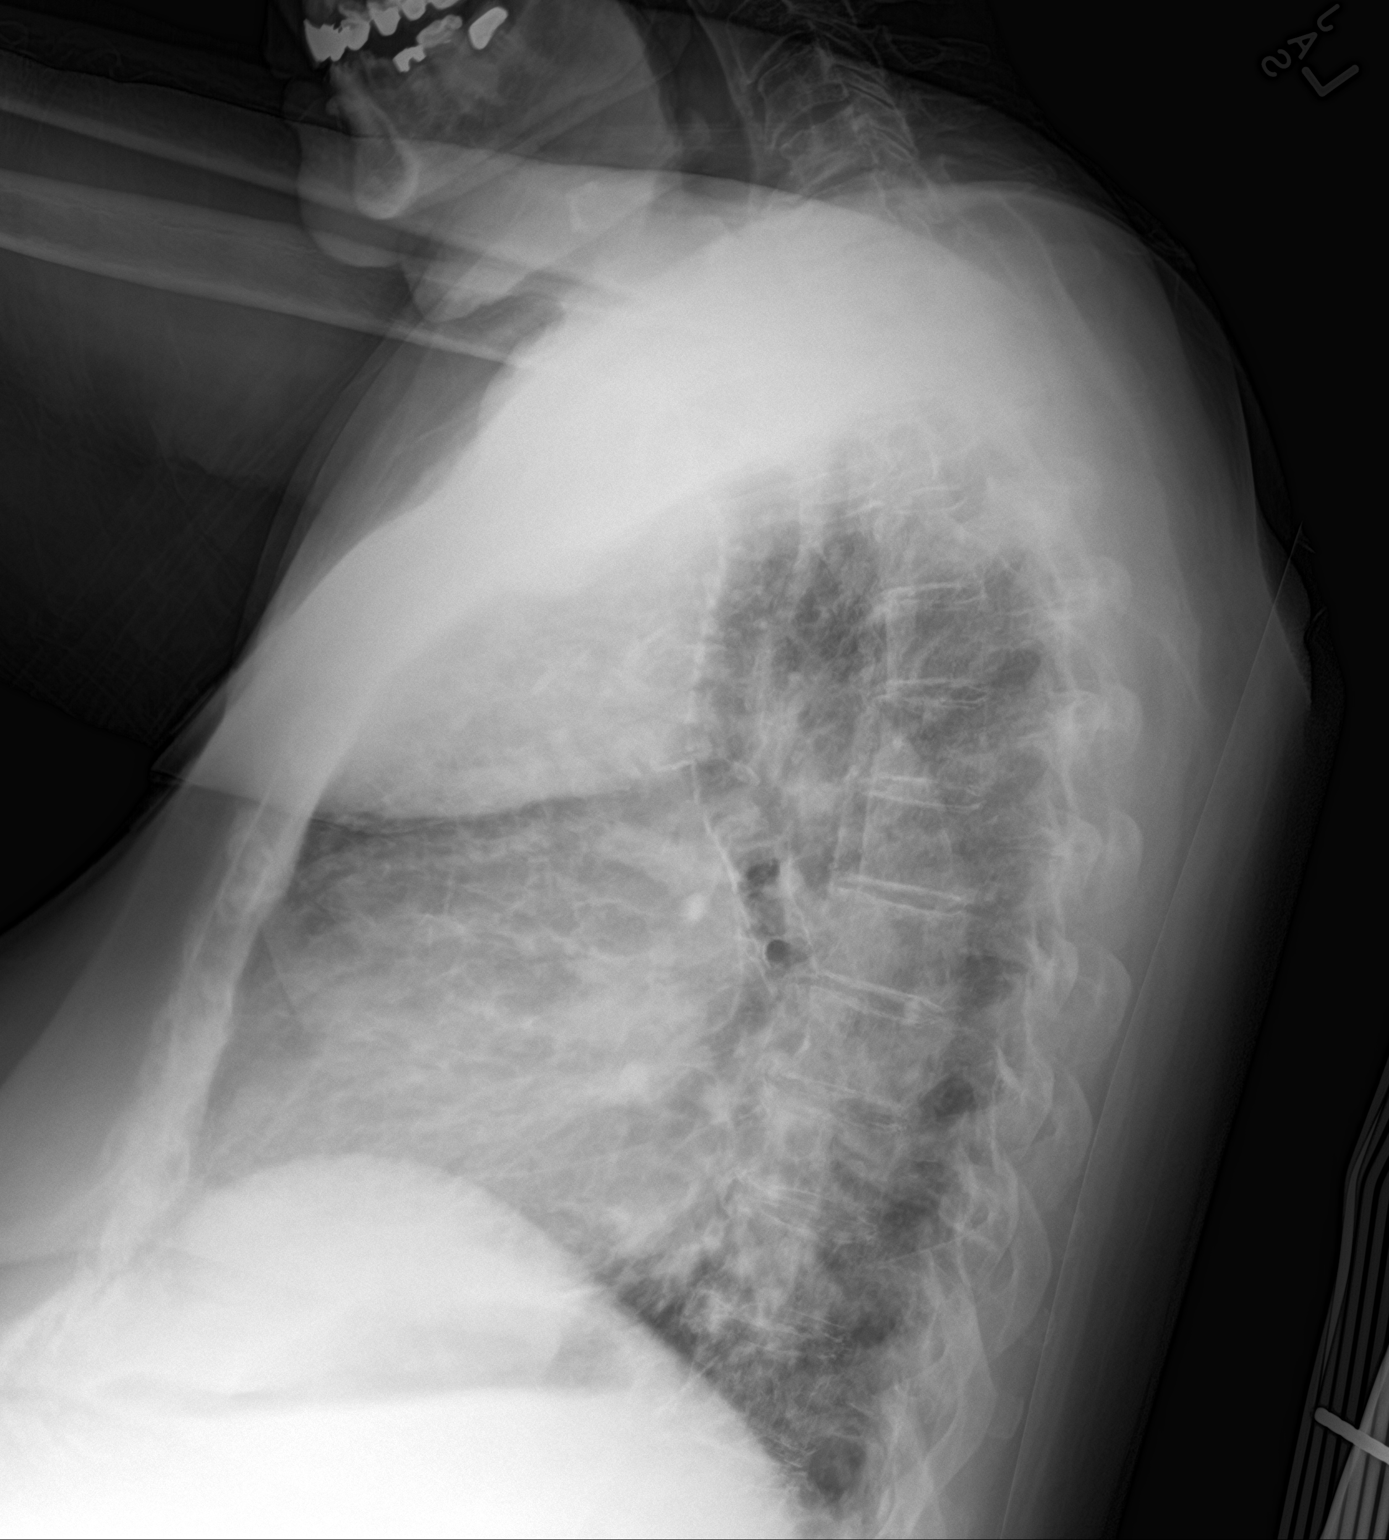

[chest ap]
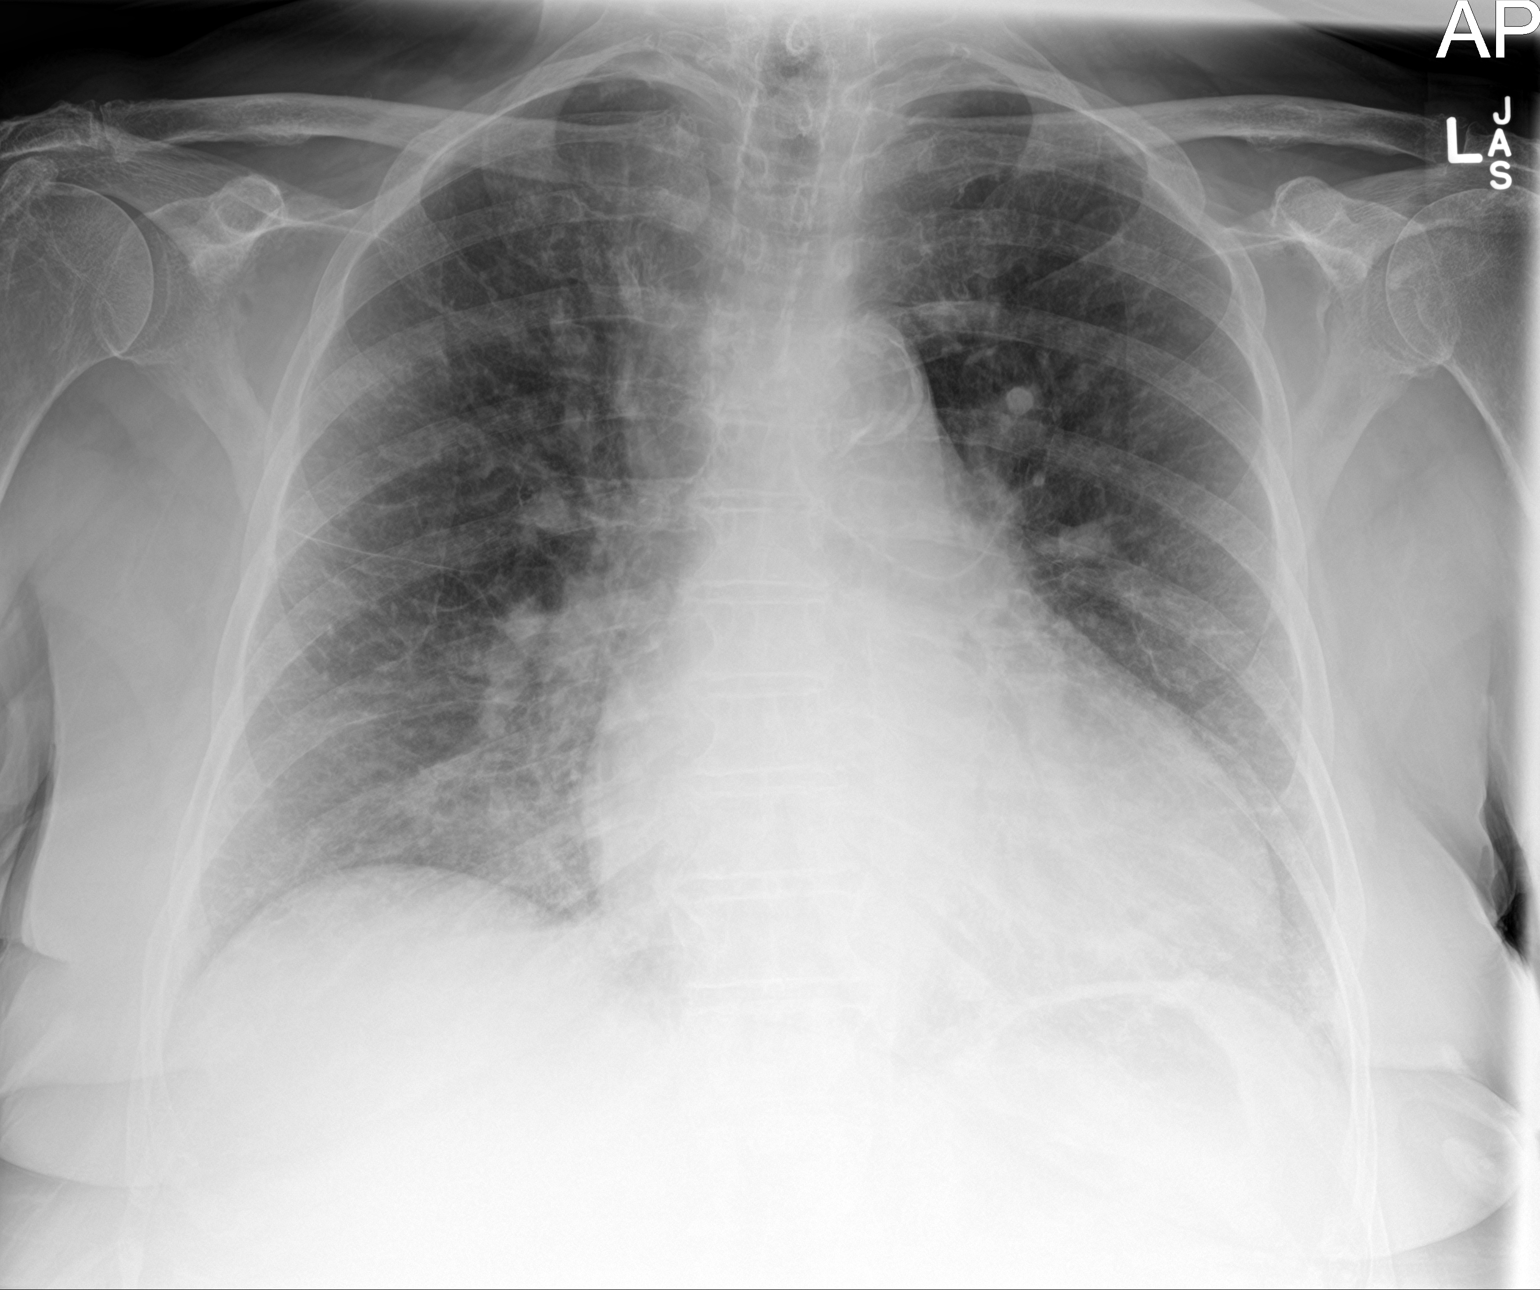

[2 of 2 positions shown; findings below may reference images not displayed]

FINDINGS: Cardiac enlargement. Pulmonary vascular congestion. Diffuse
interstitial process is similar previous study and likely represents
chronic fibrosis. Recurrent acute interstitial infiltration or edema
less likely. No blunting of costophrenic angles. No pneumothorax.
Calcified and tortuous aorta. Degenerative changes in the shoulders.
IMPRESSION: Cardiac enlargement with mild pulmonary vascular congestion. Diffuse
interstitial process in the lungs is similar to previous studies,
suggest chronic interstitial infiltration. It is possible this also
represents recurrent acute edema or interstitial pneumonia.
